# Patient Record
Sex: Female | Born: 1950 | Race: White | Hispanic: No | Smoking: Former smoker
Health system: Southern US, Community
[De-identification: ages and names within clinical notes are randomized; demographics above are authoritative.]

## PROBLEM LIST (undated history)

## (undated) DIAGNOSIS — F32A Depression, unspecified: Secondary | ICD-10-CM

## (undated) DIAGNOSIS — D649 Anemia, unspecified: Secondary | ICD-10-CM

## (undated) DIAGNOSIS — I471 Supraventricular tachycardia, unspecified: Secondary | ICD-10-CM

## (undated) DIAGNOSIS — Z8719 Personal history of other diseases of the digestive system: Secondary | ICD-10-CM

## (undated) DIAGNOSIS — M170 Bilateral primary osteoarthritis of knee: Secondary | ICD-10-CM

## (undated) DIAGNOSIS — F419 Anxiety disorder, unspecified: Secondary | ICD-10-CM

## (undated) DIAGNOSIS — I959 Hypotension, unspecified: Secondary | ICD-10-CM

## (undated) DIAGNOSIS — G8929 Other chronic pain: Secondary | ICD-10-CM

## (undated) DIAGNOSIS — R296 Repeated falls: Secondary | ICD-10-CM

## (undated) DIAGNOSIS — K219 Gastro-esophageal reflux disease without esophagitis: Secondary | ICD-10-CM

## (undated) DIAGNOSIS — F329 Major depressive disorder, single episode, unspecified: Secondary | ICD-10-CM

## (undated) DIAGNOSIS — G459 Transient cerebral ischemic attack, unspecified: Secondary | ICD-10-CM

## (undated) DIAGNOSIS — E7800 Pure hypercholesterolemia, unspecified: Secondary | ICD-10-CM

## (undated) DIAGNOSIS — H9192 Unspecified hearing loss, left ear: Secondary | ICD-10-CM

## (undated) DIAGNOSIS — M549 Dorsalgia, unspecified: Secondary | ICD-10-CM

## (undated) DIAGNOSIS — G629 Polyneuropathy, unspecified: Secondary | ICD-10-CM

## (undated) DIAGNOSIS — I499 Cardiac arrhythmia, unspecified: Secondary | ICD-10-CM

## (undated) DIAGNOSIS — IMO0001 Reserved for inherently not codable concepts without codable children: Secondary | ICD-10-CM

## (undated) DIAGNOSIS — Z8489 Family history of other specified conditions: Secondary | ICD-10-CM

## (undated) DIAGNOSIS — R112 Nausea with vomiting, unspecified: Secondary | ICD-10-CM

## (undated) DIAGNOSIS — M719 Bursopathy, unspecified: Secondary | ICD-10-CM

## (undated) DIAGNOSIS — M797 Fibromyalgia: Secondary | ICD-10-CM

## (undated) DIAGNOSIS — Z9889 Other specified postprocedural states: Secondary | ICD-10-CM

## (undated) HISTORY — PX: SHOULDER ARTHROSCOPY: SHX128

## (undated) HISTORY — PX: DILATION AND CURETTAGE OF UTERUS: SHX78

## (undated) HISTORY — PX: TUBAL LIGATION: SHX77

---

## 1998-10-23 ENCOUNTER — Other Ambulatory Visit: Admission: RE | Admit: 1998-10-23 | Discharge: 1998-10-23 | Payer: Self-pay | Admitting: Obstetrics and Gynecology

## 2003-05-04 ENCOUNTER — Encounter: Payer: Self-pay | Admitting: Emergency Medicine

## 2003-05-04 ENCOUNTER — Emergency Department (HOSPITAL_COMMUNITY): Admission: EM | Admit: 2003-05-04 | Discharge: 2003-05-04 | Payer: Self-pay | Admitting: Emergency Medicine

## 2003-05-16 ENCOUNTER — Encounter: Admission: RE | Admit: 2003-05-16 | Discharge: 2003-05-16 | Payer: Self-pay | Admitting: Internal Medicine

## 2003-05-16 ENCOUNTER — Ambulatory Visit (HOSPITAL_COMMUNITY): Admission: RE | Admit: 2003-05-16 | Discharge: 2003-05-16 | Payer: Self-pay | Admitting: Internal Medicine

## 2003-05-16 ENCOUNTER — Encounter: Payer: Self-pay | Admitting: Internal Medicine

## 2003-05-24 ENCOUNTER — Ambulatory Visit (HOSPITAL_COMMUNITY): Admission: RE | Admit: 2003-05-24 | Discharge: 2003-05-24 | Payer: Self-pay | Admitting: Internal Medicine

## 2003-09-20 ENCOUNTER — Encounter: Admission: RE | Admit: 2003-09-20 | Discharge: 2003-09-20 | Payer: Self-pay | Admitting: Internal Medicine

## 2005-07-13 ENCOUNTER — Ambulatory Visit: Payer: Self-pay | Admitting: Psychiatry

## 2005-07-13 ENCOUNTER — Other Ambulatory Visit (HOSPITAL_COMMUNITY): Admission: RE | Admit: 2005-07-13 | Discharge: 2005-07-16 | Payer: Self-pay | Admitting: Psychiatry

## 2005-08-19 ENCOUNTER — Encounter: Admission: RE | Admit: 2005-08-19 | Discharge: 2005-11-17 | Payer: Self-pay | Admitting: Psychology

## 2008-06-16 ENCOUNTER — Encounter: Admission: RE | Admit: 2008-06-16 | Discharge: 2008-06-16 | Payer: Self-pay | Admitting: Orthopedic Surgery

## 2008-09-14 ENCOUNTER — Encounter: Admission: RE | Admit: 2008-09-14 | Discharge: 2008-09-14 | Payer: Self-pay | Admitting: Orthopedic Surgery

## 2009-05-01 ENCOUNTER — Ambulatory Visit: Payer: Self-pay | Admitting: Infectious Diseases

## 2009-05-01 DIAGNOSIS — K219 Gastro-esophageal reflux disease without esophagitis: Secondary | ICD-10-CM | POA: Insufficient documentation

## 2009-05-01 DIAGNOSIS — J309 Allergic rhinitis, unspecified: Secondary | ICD-10-CM | POA: Insufficient documentation

## 2009-05-01 DIAGNOSIS — R000 Tachycardia, unspecified: Secondary | ICD-10-CM | POA: Insufficient documentation

## 2009-05-01 DIAGNOSIS — IMO0001 Reserved for inherently not codable concepts without codable children: Secondary | ICD-10-CM | POA: Insufficient documentation

## 2009-05-01 DIAGNOSIS — E7800 Pure hypercholesterolemia, unspecified: Secondary | ICD-10-CM | POA: Insufficient documentation

## 2011-08-03 DIAGNOSIS — IMO0001 Reserved for inherently not codable concepts without codable children: Secondary | ICD-10-CM

## 2011-08-03 HISTORY — DX: Reserved for inherently not codable concepts without codable children: IMO0001

## 2011-11-18 ENCOUNTER — Other Ambulatory Visit: Payer: Self-pay | Admitting: Gastroenterology

## 2011-11-19 ENCOUNTER — Ambulatory Visit
Admission: RE | Admit: 2011-11-19 | Discharge: 2011-11-19 | Disposition: A | Discharge status: Home / Self Care | Payer: Medicare Other | Source: Ambulatory Visit | Attending: Gastroenterology | Admitting: Gastroenterology

## 2012-03-16 ENCOUNTER — Emergency Department (HOSPITAL_BASED_OUTPATIENT_CLINIC_OR_DEPARTMENT_OTHER)
Admission: EM | Admit: 2012-03-16 | Discharge: 2012-03-16 | Disposition: A | Discharge status: Home / Self Care | Payer: Medicare Other | Attending: Emergency Medicine | Admitting: Emergency Medicine

## 2012-03-16 ENCOUNTER — Encounter (HOSPITAL_BASED_OUTPATIENT_CLINIC_OR_DEPARTMENT_OTHER): Payer: Self-pay | Admitting: Emergency Medicine

## 2012-03-16 ENCOUNTER — Emergency Department (HOSPITAL_BASED_OUTPATIENT_CLINIC_OR_DEPARTMENT_OTHER): Payer: Medicare Other

## 2012-03-16 DIAGNOSIS — J069 Acute upper respiratory infection, unspecified: Secondary | ICD-10-CM

## 2012-03-16 DIAGNOSIS — IMO0001 Reserved for inherently not codable concepts without codable children: Secondary | ICD-10-CM | POA: Insufficient documentation

## 2012-03-16 DIAGNOSIS — F17200 Nicotine dependence, unspecified, uncomplicated: Secondary | ICD-10-CM | POA: Insufficient documentation

## 2012-03-16 DIAGNOSIS — E7800 Pure hypercholesterolemia, unspecified: Secondary | ICD-10-CM | POA: Insufficient documentation

## 2012-03-16 HISTORY — DX: Fibromyalgia: M79.7

## 2012-03-16 HISTORY — DX: Pure hypercholesterolemia, unspecified: E78.00

## 2012-03-16 LAB — URINALYSIS, ROUTINE W REFLEX MICROSCOPIC
Ketones, ur: NEGATIVE mg/dL
Leukocytes, UA: NEGATIVE
Nitrite: NEGATIVE
Protein, ur: NEGATIVE mg/dL
Urobilinogen, UA: 0.2 mg/dL (ref 0.0–1.0)
pH: 6 (ref 5.0–8.0)

## 2012-03-16 MED ORDER — AZITHROMYCIN 250 MG PO TABS: 250.0000 mg | ORAL_TABLET | Freq: Every day | ORAL | Status: AC

## 2012-03-16 NOTE — ED Provider Notes (Signed)
History     CSN: [id number]  Arrival date & time 03/16/12  4098   First MD Initiated Contact with Patient 03/16/12 [phone]      Chief Complaint  Patient presents with  . Cough  . Headache  . Sore Throat    (Consider location/radiation/quality/duration/timing/severity/associated sxs/prior treatment) HPI Patient is a 61 year old female who presents today complaining of headache as well as sore throat over the past 4 days as well as development of a cough for the past 2 days. Patient has been around her mother who has had similar symptoms. The patient's mother was seen by her PCP and started on azithromycin for possible "viral infection versus allergies". Patient does note that her mother has COPD. Patient has no history of COPD or asthma but is a chronic smoker. She has not been treated with albuterol or prednisone for this in the past. Patient reports that her sore throat is actually better today. She was concerned as her mother got so sick with similar symptoms. Patient's PCP was unavailable today. She endorses some nausea as well some mild abdominal pain. She denies vomiting, constipation, diarrhea, chest pain, or shortness of breath. She's had no fevers. Patient has not taken anything for her symptoms. She has not tried over-the-counter medications or cough drops for her symptoms as well. Patient reports that this morning she felt significantly worse mainly based on increased fatigue and achiness. There are no other associated or modifying factors. Past Medical History  Diagnosis Date  . Fibromyalgia   . High cholesterol   . Tachycardia     History reviewed. No pertinent past surgical history.  History reviewed. No pertinent family history.  History  Substance Use Topics  . Smoking status: Current Everyday Smoker  . Smokeless tobacco: Not on file  . Alcohol Use: No    OB History    Grav Para Term Preterm Abortions TAB SAB Ect Mult Living                  Review of Systems    Constitutional: Positive for appetite change and fatigue.  HENT: Positive for congestion and sore throat.   Eyes: Negative.   Respiratory: Positive for cough.   Cardiovascular: Negative.   Gastrointestinal: Positive for nausea and abdominal pain.  Genitourinary: Negative.   Musculoskeletal: Positive for myalgias.  Skin: Negative.   Neurological: Positive for headaches.  Hematological: Negative.   Psychiatric/Behavioral: Negative.   All other systems reviewed and are negative.    Allergies  Codeine; Morphine and related; and Sulfa antibiotics  Home Medications   Current Outpatient Rx  Name Route Sig Dispense Refill  . AMITRIPTYLINE HCL 25 MG PO TABS Oral Take 25 mg by mouth 3 (three) times daily.    . ATENOLOL 50 MG PO TABS Oral Take 50 mg by mouth 2 (two) times daily.    Marland Kitchen PREMPRO PO Oral Take 1 tablet by mouth daily.    Marland Kitchen OMEPRAZOLE PO Oral Take by mouth 2 (two) times daily.    Marland Kitchen SIMVASTATIN PO Oral Take 1 tablet by mouth daily.      BP 107/55  Pulse 87  Temp 98.7 F (37.1 C) (Oral)  Resp 18  Ht 5\' 3"  (1.6 m)  Wt 150 lb (68.04 kg)  BMI 26.57 kg/m2  SpO2 100%  Physical Exam  Nursing note and vitals reviewed. GEN: Well-developed, well-nourished female in no distress HEENT: Atraumatic, normocephalic. Oropharynx with posterior erythema without exudates. EYES: PERRLA BL, no scleral icterus. NECK: Trachea midline, no  meningismus CV: regular rate and rhythm. No murmurs, rubs, or gallops PULM: No respiratory distress.  No crackles, wheezes, or rales. Diminished throughout. GI: soft, non-tender. No guarding, rebound, or tenderness. + bowel sounds  GU: deferred Neuro: cranial nerves grossly 2-12 intact, no abnormalities of strength or sensation, A and O x 3 MSK: Patient moves all 4 extremities symmetrically, no deformity, edema, or injury noted Skin: No rashes petechiae, purpura, or jaundice Psych: no abnormality of mood   ED Course  Procedures (including critical  care time)  Labs Reviewed  URINALYSIS, ROUTINE W REFLEX MICROSCOPIC - Abnormal; Notable for the following:    APPearance CLOUDY (*)     Hgb urine dipstick SMALL (*)     All other components within normal limits  URINE MICROSCOPIC-ADD ON - Abnormal; Notable for the following:    Squamous Epithelial / LPF FEW (*)     Bacteria, UA FEW (*)     All other components within normal limits   Dg Chest 2 View  03/16/2012  *RADIOLOGY REPORT*  Clinical Data: Cough, sore throat, smoking history  CHEST - 2 VIEW  Comparison: None.  Findings: No active infiltrate or effusion is seen.  The lungs are slightly hyperaerated consistent with emphysema.  There is a vague opacity in the right mid upper lung field which probably represents overlapping vascular and bony structures, but attention to this area on follow-up chest x-ray is recommended. The heart is within upper limits of normal in size.  No bony abnormality is seen.  IMPRESSION:  1.  Hyperaeration consistent with emphysema. 2.  Vague opacity in the right mid upper lung field probably overlapping bony and vascular structures.  Recommend follow-up chest x-ray. 3.  Borderline cardiomegaly  Original Report Authenticated By: Juline Patch, M.D.     1. URI (upper respiratory infection)       MDM  Patient was evaluated by myself. Based upon evaluation patient did have very diminished breath sounds. Chest x-ray was performed as she had complained of some slight abdominal pain and nausea beginning this morning. Patient had BP of 107 or 55 but was otherwise completely hemodynamically stable. Urinalysis was also sent. Patient had actually had improvement in her sore throat. No further testing was felt necessary for this complaint today. Chest x-ray was consistent with patient's chronic smoking history and COPD. There was an vague opacity noted in the right mid upper lung fields. This was thought to be artifact. Patient was notified of finding. She was given a copy of  this x-ray and told to follow this up with her primary care physician. Patient does have a PCP and is comfortable with plan. Given patient's recent exposure to her mother with similar symptoms patient was given azithromycin in the case of this being a possible early atypical infiltrate. I did not feel that further treatment was necessary today. Urine was negative. Patient most likely has a viral process. She has taken prednisone for back pain in the past and does not like the way this feels. I did not give this to her today. Additionally patient had diminished breath sounds but has never used albuterol before. She had no shortness of breath. Patient was discharged in good condition Z-Pak as described.        Cyndra Numbers, MD 03/16/12 1209

## 2012-03-16 NOTE — ED Notes (Signed)
Pt c/o "cold" sx - HA & sore throat since Mon, now has cough & feels "achy"

## 2013-05-22 ENCOUNTER — Other Ambulatory Visit: Payer: Self-pay | Admitting: Internal Medicine

## 2013-05-22 DIAGNOSIS — I10 Essential (primary) hypertension: Secondary | ICD-10-CM

## 2014-03-14 ENCOUNTER — Inpatient Hospital Stay (HOSPITAL_COMMUNITY): Payer: Medicare Other

## 2014-03-14 ENCOUNTER — Encounter (HOSPITAL_COMMUNITY): Payer: Self-pay | Admitting: Emergency Medicine

## 2014-03-14 ENCOUNTER — Observation Stay (HOSPITAL_COMMUNITY)
Admission: EM | Admit: 2014-03-14 | Discharge: 2014-03-16 | Disposition: A | Discharge status: Home / Self Care | Payer: Medicare Other | Attending: Family Medicine | Admitting: Family Medicine

## 2014-03-14 ENCOUNTER — Emergency Department (HOSPITAL_COMMUNITY): Payer: Medicare Other

## 2014-03-14 DIAGNOSIS — R55 Syncope and collapse: Secondary | ICD-10-CM

## 2014-03-14 DIAGNOSIS — R5381 Other malaise: Secondary | ICD-10-CM | POA: Insufficient documentation

## 2014-03-14 DIAGNOSIS — K219 Gastro-esophageal reflux disease without esophagitis: Secondary | ICD-10-CM | POA: Insufficient documentation

## 2014-03-14 DIAGNOSIS — R000 Tachycardia, unspecified: Secondary | ICD-10-CM | POA: Diagnosis not present

## 2014-03-14 DIAGNOSIS — M6281 Muscle weakness (generalized): Secondary | ICD-10-CM

## 2014-03-14 DIAGNOSIS — IMO0001 Reserved for inherently not codable concepts without codable children: Secondary | ICD-10-CM

## 2014-03-14 DIAGNOSIS — R0789 Other chest pain: Secondary | ICD-10-CM | POA: Diagnosis not present

## 2014-03-14 DIAGNOSIS — R42 Dizziness and giddiness: Secondary | ICD-10-CM | POA: Diagnosis not present

## 2014-03-14 DIAGNOSIS — Z87891 Personal history of nicotine dependence: Secondary | ICD-10-CM | POA: Insufficient documentation

## 2014-03-14 DIAGNOSIS — Z79899 Other long term (current) drug therapy: Secondary | ICD-10-CM | POA: Insufficient documentation

## 2014-03-14 DIAGNOSIS — R0602 Shortness of breath: Secondary | ICD-10-CM | POA: Insufficient documentation

## 2014-03-14 DIAGNOSIS — E7800 Pure hypercholesterolemia, unspecified: Secondary | ICD-10-CM | POA: Diagnosis not present

## 2014-03-14 DIAGNOSIS — R531 Weakness: Secondary | ICD-10-CM

## 2014-03-14 DIAGNOSIS — R079 Chest pain, unspecified: Secondary | ICD-10-CM

## 2014-03-14 DIAGNOSIS — R002 Palpitations: Secondary | ICD-10-CM | POA: Diagnosis present

## 2014-03-14 DIAGNOSIS — R5383 Other fatigue: Secondary | ICD-10-CM

## 2014-03-14 HISTORY — DX: Other specified postprocedural states: Z98.890

## 2014-03-14 HISTORY — DX: Personal history of other diseases of the digestive system: Z87.19

## 2014-03-14 HISTORY — DX: Nausea with vomiting, unspecified: R11.2

## 2014-03-14 HISTORY — DX: Dorsalgia, unspecified: M54.9

## 2014-03-14 HISTORY — DX: Transient cerebral ischemic attack, unspecified: G45.9

## 2014-03-14 HISTORY — DX: Supraventricular tachycardia: I47.1

## 2014-03-14 HISTORY — DX: Reserved for inherently not codable concepts without codable children: IMO0001

## 2014-03-14 HISTORY — DX: Supraventricular tachycardia, unspecified: I47.10

## 2014-03-14 HISTORY — DX: Depression, unspecified: F32.A

## 2014-03-14 HISTORY — DX: Hypotension, unspecified: I95.9

## 2014-03-14 HISTORY — DX: Major depressive disorder, single episode, unspecified: F32.9

## 2014-03-14 HISTORY — DX: Repeated falls: R29.6

## 2014-03-14 HISTORY — DX: Bilateral primary osteoarthritis of knee: M17.0

## 2014-03-14 HISTORY — DX: Unspecified hearing loss, left ear: H91.92

## 2014-03-14 HISTORY — DX: Family history of other specified conditions: Z84.89

## 2014-03-14 HISTORY — DX: Other chronic pain: G89.29

## 2014-03-14 HISTORY — DX: Anxiety disorder, unspecified: F41.9

## 2014-03-14 HISTORY — DX: Bursopathy, unspecified: M71.9

## 2014-03-14 HISTORY — DX: Anemia, unspecified: D64.9

## 2014-03-14 HISTORY — DX: Gastro-esophageal reflux disease without esophagitis: K21.9

## 2014-03-14 LAB — CBC WITH DIFFERENTIAL/PLATELET
BASOS ABS: 0 10*3/uL (ref 0.0–0.1)
Basophils Relative: 0 % (ref 0–1)
EOS PCT: 1 % (ref 0–5)
Eosinophils Absolute: 0.1 10*3/uL (ref 0.0–0.7)
HCT: 35.4 % — ABNORMAL LOW (ref 36.0–46.0)
Hemoglobin: 11.9 g/dL — ABNORMAL LOW (ref 12.0–15.0)
LYMPHS ABS: 2.6 10*3/uL (ref 0.7–4.0)
LYMPHS PCT: 34 % (ref 12–46)
MCH: 30.6 pg (ref 26.0–34.0)
MCHC: 33.6 g/dL (ref 30.0–36.0)
MCV: 91 fL (ref 78.0–100.0)
Monocytes Absolute: 0.5 10*3/uL (ref 0.1–1.0)
Monocytes Relative: 7 % (ref 3–12)
NEUTROS ABS: 4.4 10*3/uL (ref 1.7–7.7)
NEUTROS PCT: 58 % (ref 43–77)
PLATELETS: 259 10*3/uL (ref 150–400)
RBC: 3.89 MIL/uL (ref 3.87–5.11)
RDW: 14.6 % (ref 11.5–15.5)
WBC: 7.6 10*3/uL (ref 4.0–10.5)

## 2014-03-14 LAB — ETHANOL: Alcohol, Ethyl (B): <11 mg/dL (ref 0–11)

## 2014-03-14 LAB — COMPREHENSIVE METABOLIC PANEL
ALK PHOS: 82 U/L (ref 39–117)
ALT: 15 U/L (ref 0–35)
AST: 16 U/L (ref 0–37)
Albumin: 3.3 g/dL — ABNORMAL LOW (ref 3.5–5.2)
Anion gap: 12 (ref 5–15)
BUN: 8 mg/dL (ref 6–23)
CALCIUM: 8.5 mg/dL (ref 8.4–10.5)
CO2: 24 meq/L (ref 19–32)
Chloride: 103 mEq/L (ref 96–112)
Creatinine, Ser: 0.61 mg/dL (ref 0.50–1.10)
GFR calc Af Amer: >90 mL/min (ref >90)
Glucose, Bld: 94 mg/dL (ref 70–99)
POTASSIUM: 4 meq/L (ref 3.7–5.3)
SODIUM: 139 meq/L (ref 137–147)
Total Bilirubin: 0.2 mg/dL — ABNORMAL LOW (ref 0.3–1.2)
Total Protein: 6.3 g/dL (ref 6.0–8.3)

## 2014-03-14 LAB — LIPID PANEL
CHOL/HDL RATIO: 2.7 ratio
CHOLESTEROL: 139 mg/dL (ref 0–200)
HDL: 52 mg/dL (ref >39)
LDL Cholesterol: 61 mg/dL (ref 0–99)
Triglycerides: 128 mg/dL (ref <150)
VLDL: 26 mg/dL (ref 0–40)

## 2014-03-14 LAB — RAPID URINE DRUG SCREEN, HOSP PERFORMED
AMPHETAMINES: NOT DETECTED
Barbiturates: NOT DETECTED
Benzodiazepines: NOT DETECTED
COCAINE: NOT DETECTED
Opiates: NOT DETECTED
TETRAHYDROCANNABINOL: NOT DETECTED

## 2014-03-14 LAB — TROPONIN I: Troponin I: <0.3 ng/mL (ref <0.30)

## 2014-03-14 LAB — I-STAT TROPONIN, ED: Troponin i, poc: 0 ng/mL (ref 0.00–0.08)

## 2014-03-14 MED ORDER — SIMVASTATIN 40 MG PO TABS
40.0000 mg | ORAL_TABLET | Freq: Every day | ORAL | Status: DC
  Administered 2014-03-15: 40 mg via ORAL
  Filled 2014-03-14 (×2): qty 1

## 2014-03-14 MED ORDER — LAMOTRIGINE 200 MG PO TABS
200.0000 mg | ORAL_TABLET | Freq: Every day | ORAL | Status: DC
  Administered 2014-03-14 – 2014-03-15 (×2): 200 mg via ORAL
  Filled 2014-03-14 (×3): qty 1

## 2014-03-14 MED ORDER — LORATADINE 10 MG PO CAPS: 10.0000 mg | ORAL_CAPSULE | Freq: Every day | ORAL | Status: DC | PRN

## 2014-03-14 MED ORDER — PANTOPRAZOLE SODIUM 40 MG PO TBEC
80.0000 mg | DELAYED_RELEASE_TABLET | Freq: Every day | ORAL | Status: DC
Start: 2014-03-15 — End: 2014-03-15
  Administered 2014-03-14 – 2014-03-15 (×2): 80 mg via ORAL
  Filled 2014-03-14: qty 2

## 2014-03-14 MED ORDER — HEPARIN SODIUM (PORCINE) 5000 UNIT/ML IJ SOLN
5000.0000 [IU] | Freq: Three times a day (TID) | INTRAMUSCULAR | Status: DC
  Administered 2014-03-14 – 2014-03-16 (×5): 5000 [IU] via SUBCUTANEOUS
  Filled 2014-03-14 (×9): qty 1

## 2014-03-14 MED ORDER — IPRATROPIUM BROMIDE 0.06 % NA SOLN
2.0000 | Freq: Four times a day (QID) | NASAL | Status: DC
  Administered 2014-03-16: 2 via NASAL
  Filled 2014-03-14 (×2): qty 15

## 2014-03-14 MED ORDER — ASPIRIN 81 MG PO CHEW
324.0000 mg | CHEWABLE_TABLET | Freq: Once | ORAL | Status: AC
  Administered 2014-03-14: 324 mg via ORAL
  Filled 2014-03-14: qty 4

## 2014-03-14 MED ORDER — ATENOLOL 50 MG PO TABS
50.0000 mg | ORAL_TABLET | Freq: Two times a day (BID) | ORAL | Status: DC
  Administered 2014-03-15 – 2014-03-16 (×3): 50 mg via ORAL
  Filled 2014-03-14 (×5): qty 1

## 2014-03-14 MED ORDER — SODIUM CHLORIDE 0.9 % IJ SOLN
3.0000 mL | Freq: Two times a day (BID) | INTRAMUSCULAR | Status: DC
  Administered 2014-03-14 – 2014-03-16 (×3): 3 mL via INTRAVENOUS

## 2014-03-14 MED ORDER — LORATADINE 10 MG PO TABS
10.0000 mg | ORAL_TABLET | Freq: Every day | ORAL | Status: DC
  Administered 2014-03-15 – 2014-03-16 (×2): 10 mg via ORAL
  Filled 2014-03-14 (×2): qty 1

## 2014-03-14 MED ORDER — RISPERIDONE 0.5 MG PO TABS
0.5000 mg | ORAL_TABLET | Freq: Two times a day (BID) | ORAL | Status: DC
  Administered 2014-03-14 – 2014-03-16 (×4): 0.5 mg via ORAL
  Filled 2014-03-14 (×5): qty 1

## 2014-03-14 MED ORDER — ADULT MULTIVITAMIN W/MINERALS CH
1.0000 | ORAL_TABLET | Freq: Every day | ORAL | Status: DC
  Administered 2014-03-15 – 2014-03-16 (×2): 1 via ORAL
  Filled 2014-03-14 (×2): qty 1

## 2014-03-14 MED ORDER — AMITRIPTYLINE HCL 25 MG PO TABS
25.0000 mg | ORAL_TABLET | Freq: Three times a day (TID) | ORAL | Status: DC
  Administered 2014-03-14: 25 mg via ORAL
  Filled 2014-03-14 (×4): qty 1

## 2014-03-14 MED ORDER — SODIUM CHLORIDE 0.9 % IV BOLUS (SEPSIS)
1000.0000 mL | Freq: Once | INTRAVENOUS | Status: AC
  Administered 2014-03-14: 1000 mL via INTRAVENOUS

## 2014-03-14 NOTE — ED Notes (Signed)
Pt arrives via EMS from home with palpitation that began approx 730 this AM while walking around the kitchen this AM. Pt states again had palpitations at 930 and SOB. EMS 12 lead unremarkable. Given 02 for supportive measures which helped SOB. Pt awake, alert, oriented x4, NAD. Pts family members state patient has fallen twice in the last 3 weeks ago due to dizziness.

## 2014-03-14 NOTE — ED Provider Notes (Signed)
CSN: [id number]     Arrival date & time 03/14/14  1012 History   First MD Initiated Contact with Patient 03/14/14 1035     Chief Complaint  Patient presents with  . Palpitations  . Shortness of Breath     (Consider location/radiation/quality/duration/timing/severity/associated sxs/prior Treatment) HPI Comments: Patient presents emergency department with chief complaint of heart palpitations that began this morning at 7:30 while she was walking in her kitchen. She states that in the palpitations then reoccurred at 9:30. She reports some associated shortness of breath and chest pressure. She states that she has a history of the same remotely, but has not been seen by cardiology in over 15 years. Additionally, she states that she gets very dizzy easily, and falls frequently. She is currently taking atenolol 50 mg twice a day, but states that she increases this to 100 mg twice a day when she has palpitations. The symptoms are worse when she stands. She denies any other problems at this time.  The history is provided by the patient. No language interpreter was used.    Past Medical History  Diagnosis Date  . Fibromyalgia   . High cholesterol   . Tachycardia   . GERD (gastroesophageal reflux disease)    Past Surgical History  Procedure Laterality Date  . Shoulder surgery     No family history on file. History  Substance Use Topics  . Smoking status: Former Smoker    Quit date: 03/14/2012  . Smokeless tobacco: Not on file  . Alcohol Use: Yes     Comment: occasional   OB History   Grav Para Term Preterm Abortions TAB SAB Ect Mult Living                 Review of Systems  All other systems reviewed and are negative.     Allergies  Codeine; Morphine and related; and Sulfa antibiotics  Home Medications   Prior to Admission medications   Medication Sig Start Date End Date Taking? Authorizing Provider  amitriptyline (ELAVIL) 25 MG tablet Take 25 mg by mouth 3 (three) times  daily.    Historical Provider, MD  atenolol (TENORMIN) 50 MG tablet Take 50 mg by mouth 2 (two) times daily.    Historical Provider, MD  Conj Estrog-Medroxyprogest Ace (PREMPRO PO) Take 1 tablet by mouth daily.    Historical Provider, MD  OMEPRAZOLE PO Take by mouth 2 (two) times daily.    Historical Provider, MD  SIMVASTATIN PO Take 1 tablet by mouth daily.    Historical Provider, MD   Pulse 69  Temp(Src) 98.1 F (36.7 C) (Oral)  Resp 18  SpO2 96% Physical Exam  Nursing note and vitals reviewed. Constitutional: She is oriented to person, place, and time. She appears well-developed and well-nourished.  HENT:  Head: Normocephalic and atraumatic.  Eyes: Conjunctivae and EOM are normal. Pupils are equal, round, and reactive to light.  Neck: Normal range of motion. Neck supple.  Cardiovascular: Normal rate and regular rhythm.  Exam reveals no gallop and no friction rub.   No murmur heard. Pulmonary/Chest: Effort normal and breath sounds normal. No respiratory distress. She has no wheezes. She has no rales. She exhibits no tenderness.  Abdominal: Soft. Bowel sounds are normal. She exhibits no distension and no mass. There is no tenderness. There is no rebound and no guarding.  Musculoskeletal: Normal range of motion. She exhibits no edema and no tenderness.  Neurological: She is alert and oriented to person, place, and time.  Skin: Skin is warm and dry.  Psychiatric: She has a normal mood and affect. Her behavior is normal. Judgment and thought content normal.    ED Course  Procedures (including critical care time) Results for orders placed during the hospital encounter of 03/14/14  CBC WITH DIFFERENTIAL      Result Value Ref Range   WBC 7.6  4.0 - 10.5 K/uL   RBC 3.89  3.87 - 5.11 MIL/uL   Hemoglobin 11.9 (*) 12.0 - 15.0 g/dL   HCT 29.5 (*) 62.1 - 30.8 %   MCV 91.0  78.0 - 100.0 fL   MCH 30.6  26.0 - 34.0 pg   MCHC 33.6  30.0 - 36.0 g/dL   RDW 65.7  84.6 - 96.2 %   Platelets 259   150 - 400 K/uL   Neutrophils Relative % 58  43 - 77 %   Neutro Abs 4.4  1.7 - 7.7 K/uL   Lymphocytes Relative 34  12 - 46 %   Lymphs Abs 2.6  0.7 - 4.0 K/uL   Monocytes Relative 7  3 - 12 %   Monocytes Absolute 0.5  0.1 - 1.0 K/uL   Eosinophils Relative 1  0 - 5 %   Eosinophils Absolute 0.1  0.0 - 0.7 K/uL   Basophils Relative 0  0 - 1 %   Basophils Absolute 0.0  0.0 - 0.1 K/uL  COMPREHENSIVE METABOLIC PANEL      Result Value Ref Range   Sodium 139  137 - 147 mEq/L   Potassium 4.0  3.7 - 5.3 mEq/L   Chloride 103  96 - 112 mEq/L   CO2 24  19 - 32 mEq/L   Glucose, Bld 94  70 - 99 mg/dL   BUN 8  6 - 23 mg/dL   Creatinine, Ser 9.52  0.50 - 1.10 mg/dL   Calcium 8.5  8.4 - 84.1 mg/dL   Total Protein 6.3  6.0 - 8.3 g/dL   Albumin 3.3 (*) 3.5 - 5.2 g/dL   AST 16  0 - 37 U/L   ALT 15  0 - 35 U/L   Alkaline Phosphatase 82  39 - 117 U/L   Total Bilirubin 0.2 (*) 0.3 - 1.2 mg/dL   GFR calc non Af Amer >90  >90 mL/min   GFR calc Af Amer >90  >90 mL/min   Anion gap 12  5 - 15  I-STAT TROPOININ, ED      Result Value Ref Range   Troponin i, poc 0.00  0.00 - 0.08 ng/mL   Comment 3            Dg Chest 2 View  03/14/2014   CLINICAL DATA:  Shortness of breath and cardiac palpitations  EXAM: CHEST  2 VIEW  COMPARISON:  March 16, 2012  FINDINGS: There is no edema or consolidation. Heart size and pulmonary vascularity are normal. No adenopathy. No bone lesions. A previously noted somewhat vague nodular opacity in the right upper lobe is no longer appreciable.  IMPRESSION: No edema or consolidation.   Electronically Signed   By: Bretta Bang M.D.   On: 03/14/2014 11:05     Imaging Review No results found.   EKG Interpretation   Date/Time:  Thursday March 14 2014 10:23:46 EDT Ventricular Rate:  70 PR Interval:  171 QRS Duration: 86 QT Interval:  390 QTC Calculation: 421 R Axis:   24 Text Interpretation:  Sinus rhythm Low voltage, precordial leads No  previous ECGs  available  Confirmed by YAO  MD, DAVID ([id number]) on 03/14/2014  10:27:58 AM      MDM   Final diagnoses:  Palpitations  Syncope and collapse    Patient with dizziness and SOB with heart palpitations.  History of SVT, has been taking atenolol for the past 15 years.  Not followed by cardiology now.  Will check labs, EKG, and CXR.  Discussed patient with Dr. Silverio Lay, who will see the patient.  Will order delta troponin.  Consider cardiology consult.  Patient seen by and discussed with Dr. Silverio Lay.  Cardiology was consulted.  Dr. Elease Hashimoto recommends admission to medicine.  Discussed the patient with family practice, who will admit.   Roxy Horseman, PA-C 03/14/14 1536

## 2014-03-14 NOTE — Consult Note (Signed)
Cardiology Consultation Note  Patient ID: Terry Ball, MRN: [id number], DOB/AGE: 1950-11-26 63 y.o. Admit date: 03/14/2014   Date of Consult: 03/14/2014 Primary Physician: Mia Creek, MD Primary Cardiologist: New to [facility]  Chief Complaint: Palpitations and SOB Reason for Consult: Palpitations and SOB  HPI: 63 year old female with history of reported SVT 15 years (diagnosed at [facility]), TIAs, and hyperlipidemia who presents to [facility] ED today after developing palpitations with associated chest tightness around 7:30 AM lasting approximately 15-20 minutes. She was checking her pulse feeling 2-4 beats at a time, then a skipped beat. She takes Atenolol 50 mg bid regularly for her previously diagnosed SVT so she went ahead and took an extra 50 mg tab this AM for a total of 100 mg. She dialed 911, but set them home upon arrival 2/2 her symptoms had resolved. She reports approximately 30 minutes later her palpitations returned prompting her to again dial 911, this time coming to the hospital. Her chest tightness is characterized as substernal and does not radiate. No associated nausea or vomiting. No diaphoresis.  Of note, she has suffered quite a lot of falls lately dating back to the beginning of March. Some of these are falls in which she has fallen off to the side and others are her falling backwards. She also had a fall back in March where she hit her head pretty good. No medical care was sought. She is unaware of any palpitations during those events. She also denies any seizure-like activity. No loss of bowel or bladder function. Denies syncope. She is a fairly active person at home, even mows the lawn. Some increase in SOB lately. Sleeps with one pillow. Some early satiety. No LEE.  Her daughter, who is a critical care nurse here at [facility], reports the patient had a negative nuclear treadmill 1.5 years ago at [facility].   In the ED she was found to have a negative POCT  troponin, K+ 4.0, hgb 11.9, CXR with no edema or consolidation, BP running between 106/51-117/54, pulse 84.  Past Medical History  Diagnosis Date  . Fibromyalgia   . High cholesterol   . Tachycardia     SVT-diagnosed by [facility] 2000  . GERD (gastroesophageal reflux disease)   . Normal cardiac stress test 2013      Most Recent Cardiac Studies: See above for reported studies from [facility].    Surgical History:  Past Surgical History  Procedure Laterality Date  . Shoulder surgery       Home Meds: Prior to Admission medications   Medication Sig Start Date End Date Taking? Authorizing Provider  amitriptyline (ELAVIL) 25 MG tablet Take 25 mg by mouth 3 (three) times daily.    Historical Provider, MD  atenolol (TENORMIN) 50 MG tablet Take 50 mg by mouth 2 (two) times daily.    Historical Provider, MD  Conj Estrog-Medroxyprogest Ace (PREMPRO PO) Take 1 tablet by mouth daily.    Historical Provider, MD  OMEPRAZOLE PO Take by mouth 2 (two) times daily.    Historical Provider, MD  SIMVASTATIN PO Take 1 tablet by mouth daily.    Historical Provider, MD    Inpatient Medications:     Allergies:  Allergies  Allergen Reactions  . Codeine     REACTION: rash, itching  . Morphine And Related Itching    + nausea  . Sulfa Antibiotics Nausea Only    History   Social History  . Marital Status:  Widowed    Spouse Name: [name]    Number of Children: [name]  . Years of Education: [name]   Occupational History  . Not on file.   Social History Main Topics  . Smoking status: Former Smoker -- 1.00 packs/day for 30 years    Types: Cigarettes    Quit date: 03/14/2012  . Smokeless tobacco: Never Used  . Alcohol Use: Yes     Comment: occasional  . Drug Use: No  . Sexual Activity: Not on file   Other Topics Concern  . Not on file   Social History Narrative  . No narrative on file     Family History  Problem Relation Age of Onset  . CAD Brother   . CAD Paternal Grandfather       Review of Systems: General: negative for chills, fever, night sweats or weight changes.  Cardiovascular: negative for edema, orthopnea, paroxysmal nocturnal dyspnea, shortness of breath or dyspnea on exertion Dermatological: negative for rash Respiratory: negative for cough or wheezing Urologic: negative for hematuria Abdominal: negative for nausea, vomiting, diarrhea, bright red blood per rectum, melena, or hematemesis Neurologic: negative for visual changes, syncope, or dizziness All other systems reviewed and are otherwise negative except as noted above.  Labs: No results found for this basename: CKTOTAL, CKMB, TROPONINI,  in the last 72 hours Lab Results  Component Value Date   WBC 7.6 03/14/2014   HGB 11.9* 03/14/2014   HCT 35.4* 03/14/2014   MCV 91.0 03/14/2014   PLT 259 03/14/2014     Recent Labs Lab 03/14/14 1035  NA 139  K 4.0  CL 103  CO2 24  BUN 8  CREATININE 0.61  CALCIUM 8.5  PROT 6.3  BILITOT 0.2*  ALKPHOS 82  ALT 15  AST 16  GLUCOSE 94   No results found for this basename: CHOL,  HDL,  LDLCALC,  TRIG   No results found for this basename: DDIMER    Radiology/Studies:  Dg Chest 2 View  03/14/2014   CLINICAL DATA:  Shortness of breath and cardiac palpitations  EXAM: CHEST  2 VIEW  COMPARISON:  March 16, 2012  FINDINGS: There is no edema or consolidation. Heart size and pulmonary vascularity are normal. No adenopathy. No bone lesions. A previously noted somewhat vague nodular opacity in the right upper lobe is no longer appreciable.  IMPRESSION: No edema or consolidation.   Electronically Signed   By: Bretta Bang M.D.   On: 03/14/2014 11:05    EKG: NSR, 70, low voltage precordial leads, no st/t changes  Physical Exam: Blood pressure 106/61, pulse 71, temperature 98.2 F (36.8 C), temperature source Oral, resp. rate 12, SpO2 100.00%. General: Well developed, well nourished, in no acute distress. Head: Normocephalic, atraumatic, sclera non-icteric,  no xanthomas, nares are without discharge.  Neck: Negative for carotid bruits. JVD not elevated. Lungs: Clear bilaterally to auscultation without wheezes, rales, or rhonchi. Breathing is unlabored. Heart: RRR with S1 S2. No murmurs, rubs, or gallops appreciated. Abdomen: Soft, non-tender, non-distended with normoactive bowel sounds. No hepatomegaly. No rebound/guarding. No obvious abdominal masses. Msk:  Strength and tone appear normal for age. Extremities: No clubbing or cyanosis. No edema.  Distal pedal pulses are 2+ and equal bilaterally. Multiple late stage bruising of the bilateral legs.  Neuro: Alert and oriented X 3. Very slight right sided lip droop. No focal deficit. Moves all extremities spontaneously. 5/5 strength. Slight increase sensation of the right-side face.  Psych:  Responds to questions appropriately with a  normal affect.     Assessment and Plan: 63 year old female with history of reported SVT 15 years ago, TIAs, and hyperlipidemia who presented to [facility] today with palpitations, SOB, and multiple falls.  1) Palpitations: She reports a history of SVT/palpitations approximately 15 years ago diagnosed by [facility]. She was placed on Atenolol 50 mg bid for this at that time and his been on this ever since. Today was the first time she has ever taken an extra dose of her Atenolol 2/2 palpitations. In the ER she is currently in NSR with a HR in the 70s and asymptomatic. Given her history of multiple falls she needs a workup for further arrhythmia. While an inpatient telemetry will help us look at her rhythm. If we do not see any specific arrhythmia on telemetry would need outpatient monitoring.   2) Chest tightness: She has a family history significant of CAD. Brother and grandfather both with MI. Her only nuclear study was 1.5 years ago and per patient's family normal, although I do not have this report. She has never had a cardiac cath. First troponin is negative.  Will cycle troponin and obtain echo. If studies dictate consider further ischemic workup and possible cardiac cath, although with normal nuclear recently may be low probability for ischemia.   3) Multiple falls: There is question if her falls are 2/2 her palpitations, possible ischemia, vs vascular disease. Will check head CT, carotid dopplers, and cardiac evaluation per above. Add on TSH. If no obvious long pauses on telemetry overnight will have neuro see her. Blood glucose 94. Review of telemetry indicates NSR with rates in the 70s. Without long pauses seen on telemetry or syncope associated with these falls it may make them less likely to be 2/2 cardiac etiology. Would pursue neuro work up. We will consult on her.   4) Hyperlipidemia: On statin.          Elinor DodgeSigned, DUNN,RYAN PA-C 03/14/2014, 2:43 PM  Attending Note:   The patient was seen and examined.  Agree with assessment and plan as noted above.  Changes made to the above note as needed.  Very unusual presentation -  Hx of SVT in the past Hx of TIAs Anxiety Loss of balance - falls to the right and to the left.  1. Palpitations:  Patient had palpitations this am - lasted for 20 minutes.  Called EMS but they were not able to find any heart rate irregularities.  Called EMS when the palpitations returned and was brought to the ED.  No syncope today.  Here in the ED, her rhythm is perfectly regular. No pauses, no tachycardia.  She needs to be monitored for several days to make sure she is not having significant pauses.  She can go home with an event monitor.  Can even get a Loop recorder .  Echo  Troponin levels.   2. Frequent falls:  These episodes sound more like a vestibular issue.  I cannot rule out sinus pauses and we need to watch her for this but her rhythm has been very stable today ( even after taking the extra Atenolol, she has not had any pauses)   3. Hx of TIA:  She needs a neuro evaluation.  She may be having more TIAs.   Needs carotid duplex, may need TEE and or Implantable Loop recorder.   4. Hx of SVT:  We do not have any records of this.  This may in fact be a-fib given  her hx of TIAs also.  Will need to watch for this .    Following .    Vesta Mixer, Montez Hageman., MD, [facility] 03/14/2014, 3:03 PM [address]  [address] Office [phone] Pager [phone]

## 2014-03-14 NOTE — H&P (Signed)
Family Medicine Teaching [facility] Admission History and Physical Service Pager: [phone]  Patient name: Terry Ball Medical record number: [id number] Date of birth: 12/16/1950 Age: 63 y.o. Gender: female  Primary Care Provider: Mia Creek, MD Consultants: cards Code Status:   Chief Complaint: chest palpitations; increased falls   Assessment and Plan: Terry Ball is a 63 y.o. female presenting with  . PMH is significant for SVT, TIA, hyperlipidemia   Chest palps- Currently in sinus rhythm with nml EKG and neg itrop, no assc syncopal episodes. Monitored on tele in the ED without arrythmias. No signs of electrolyte abnormalities; Questionable paroxysmal arrythmia? Vs sinus pause vs valvular dysfunction vs angina (Did have stress test at [facility] several months ago that was apparently normal but has fmhx signif for CAD). S/p cards eval in the ED -admit to tele under Dr.Hensel -cycle trops -2D echo -vitals per floor protocol -potentially will need Loop -cards following, appreciate recs  Falls- in setting of prior TIA; questionably related to the above, ddx includes TIA (esp with hx will look at carotids and risk stratify, unable to calculate ASCVD without further information) vs cerebellar pathology (given visual issues and imbalance) vs arrythmia (in light of prior SVT?) vs vertigo vs vestibular imbalance (does have a hx of vertigo, but having falls forward as well as backward) vs subclinical seizures(no assc seizure like activity with episodes? but appears that these are largely unwitnessed); -CT head with/without -b12/folate -hiv/rpr  -carotid doppler -neuro consult, appreciate recs -A1C, TSH -UDS -fall precautions, up with assistance  Fibromyalgia- chronic; on elavil -will cont home medication  Bipolar?- on lamictal and risperidal? Has anxious/unusual affect, although very pleasant -will attempt to clarify hx further with patient -may benefit from  MSE  GERD- taking NSAIDs in addition to twice daily PPIs -hosp formulary protonix here  Hyperlipidemia- on statin -continue   FEN/GI: KVO; gen diet Prophylaxis: HSQ  Disposition: admit to tele under Dr. Leveda Anna   History of Present Illness: Terry Ball is a 63 y.o. female presenting with palpitations and assc chest tightness starting around 7:30 this morning that lasted approx 15 minutes and again at 830. Did have a similar episode July 30, august 1st. Has assc SOB with sx. Took an additional atenolol and called 911. Does have a hx of SVT (dx at [facility])  Of note she has had increased falls in the last 1 year. Recently fell off mower. Can happen even while standing. Did have one episode of head trauma assc, falling both backwards and forwards. Does note that she is unstable prior to episode and feels an assc flutter in her chest but denies CP, SOB prior.  In the ED she was found to have a negative POCT troponin, K+ 4.0, hgb 11.9, CXR with no edema or consolidation, BP running between 106/51-117/54, pulse 84  Review Of Systems: Per HPI with the following additions: none Otherwise 12 point review of systems was performed and was unremarkable.  Patient Active Problem List   Diagnosis Date Noted  . Palpitations 03/14/2014  . HYPERCHOLESTEROLEMIA 05/01/2009  . ALLERGIC RHINITIS CAUSE UNSPECIFIED 05/01/2009  . GERD 05/01/2009  . FIBROMYALGIA 05/01/2009  . UNSPECIFIED TACHYCARDIA 05/01/2009   Past Medical History: Past Medical History  Diagnosis Date  . Fibromyalgia   . High cholesterol   . Tachycardia     SVT-diagnosed by [facility] 2000  . GERD (gastroesophageal reflux disease)   . Normal cardiac stress test 2013   Past Surgical History: Past Surgical  History  Procedure Laterality Date  . Shoulder surgery     Social History: History  Substance Use Topics  . Smoking status: Former Smoker -- 1.00 packs/day for 30 years    Types: Cigarettes    Quit  date: 03/14/2012  . Smokeless tobacco: Never Used  . Alcohol Use: Yes     Comment: occasional   Additional social history: former user of cigs; lives alone  Please also refer to relevant sections of EMR.  Family History: Family History  Problem Relation Age of Onset  . CAD Brother   . CAD Paternal Grandfather    Allergies and Medications: Allergies  Allergen Reactions  . Codeine     REACTION: rash, itching  . Morphine And Related Itching    + nausea  . Sulfa Antibiotics Nausea Only   No current facility-administered medications on file prior to encounter.   Current Outpatient Prescriptions on File Prior to Encounter  Medication Sig Dispense Refill  . amitriptyline (ELAVIL) 25 MG tablet Take 25 mg by mouth 3 (three) times daily.      Marland Kitchen atenolol (TENORMIN) 50 MG tablet Take 50 mg by mouth 2 (two) times daily.      Marland Kitchen Conj Estrog-Medroxyprogest Ace (PREMPRO PO) Take 1 tablet by mouth daily.        Objective: BP 109/52  Pulse 74  Temp(Src) 98.2 F (36.8 C) (Oral)  Resp 16  SpO2 98% Exam: General: anxious affect; awake, conversant, pleasant HEENT: NCAT, pinpoint pupils, no lymphadenopathy appreciated Cardiovascular: RRR, no m/r/g Respiratory: CTAB, normal WOB Abdomen: S, NTND, normoactive Extremities: Varices present; bilat feet cold; DPs 1+ Skin: No signs of skin break down;   Neuro: A&O X3; CN ii-xii tested (some decrease sensation on left side of face); no clonus present; neg rhomberg; FTN/HTS/RAM without deficit; strength equal and symmetric on upper and lower extremities; gait not formally tested  Labs and Imaging: CBC BMET   Recent Labs Lab 03/14/14 1035  WBC 7.6  HGB 11.9*  HCT 35.4*  PLT 259    Recent Labs Lab 03/14/14 1035  NA 139  K 4.0  CL 103  CO2 24  BUN 8  CREATININE 0.61  GLUCOSE 94  CALCIUM 8.5     CXR 03/14/14 IMPRESSION:  No edema or consolidation.   itrop neg  Charlane Ferretti, MD 03/14/2014, 3:34 PM PGY-2, [facility] FPTS Intern pager: [phone], text pages welcome

## 2014-03-15 ENCOUNTER — Encounter (HOSPITAL_COMMUNITY): Payer: Self-pay | Admitting: General Practice

## 2014-03-15 ENCOUNTER — Observation Stay (HOSPITAL_COMMUNITY): Payer: Medicare Other

## 2014-03-15 DIAGNOSIS — R002 Palpitations: Secondary | ICD-10-CM | POA: Diagnosis not present

## 2014-03-15 DIAGNOSIS — IMO0001 Reserved for inherently not codable concepts without codable children: Secondary | ICD-10-CM

## 2014-03-15 DIAGNOSIS — R072 Precordial pain: Secondary | ICD-10-CM

## 2014-03-15 DIAGNOSIS — R55 Syncope and collapse: Secondary | ICD-10-CM

## 2014-03-15 LAB — VITAMIN B12: VITAMIN B 12: 745 pg/mL (ref 211–911)

## 2014-03-15 LAB — HEMOGLOBIN A1C
Hgb A1c MFr Bld: 6.3 % — ABNORMAL HIGH (ref <5.7)
MEAN PLASMA GLUCOSE: 134 mg/dL — AB (ref <117)

## 2014-03-15 LAB — HIV ANTIBODY (ROUTINE TESTING W REFLEX): HIV: NONREACTIVE

## 2014-03-15 LAB — TSH: TSH: 1.32 u[IU]/mL (ref 0.350–4.500)

## 2014-03-15 LAB — TROPONIN I

## 2014-03-15 LAB — FOLATE RBC: RBC FOLATE: 717 ng/mL — AB (ref >280)

## 2014-03-15 LAB — RPR

## 2014-03-15 MED ORDER — ONDANSETRON 4 MG PO TBDP
4.0000 mg | ORAL_TABLET | Freq: Once | ORAL | Status: AC
  Administered 2014-03-15: 4 mg via ORAL
  Filled 2014-03-15: qty 1

## 2014-03-15 MED ORDER — AMITRIPTYLINE HCL 25 MG PO TABS
25.0000 mg | ORAL_TABLET | Freq: Every day | ORAL | Status: DC
  Filled 2014-03-15: qty 1

## 2014-03-15 MED ORDER — PANTOPRAZOLE SODIUM 40 MG PO TBEC
40.0000 mg | DELAYED_RELEASE_TABLET | Freq: Every day | ORAL | Status: DC
  Administered 2014-03-16: 40 mg via ORAL
  Filled 2014-03-15: qty 1

## 2014-03-15 MED ORDER — STROKE: EARLY STAGES OF RECOVERY BOOK
Freq: Once | Status: AC
  Administered 2014-03-16: 06:00:00
  Filled 2014-03-15: qty 1

## 2014-03-15 MED ORDER — LORAZEPAM 2 MG/ML IJ SOLN
1.0000 mg | Freq: Once | INTRAMUSCULAR | Status: AC
  Administered 2014-03-15: 1 mg via INTRAVENOUS
  Filled 2014-03-15: qty 1

## 2014-03-15 MED ORDER — DULOXETINE HCL 30 MG PO CPEP
30.0000 mg | ORAL_CAPSULE | Freq: Every day | ORAL | Status: DC
  Administered 2014-03-15 – 2014-03-16 (×2): 30 mg via ORAL
  Filled 2014-03-15 (×2): qty 1

## 2014-03-15 MED ORDER — ONDANSETRON HCL 4 MG/2ML IJ SOLN: 4.0000 mg | Freq: Once | INTRAMUSCULAR | Status: DC

## 2014-03-15 MED ORDER — ACETAMINOPHEN 325 MG PO TABS
650.0000 mg | ORAL_TABLET | Freq: Four times a day (QID) | ORAL | Status: DC | PRN
  Administered 2014-03-15 – 2014-03-16 (×3): 650 mg via ORAL
  Filled 2014-03-15 (×3): qty 2

## 2014-03-15 NOTE — Progress Notes (Signed)
  Echocardiogram 2D Echocardiogram has been performed.  Terry Ball, Terry Ball 03/15/2014, 9:58 AM

## 2014-03-15 NOTE — Evaluation (Signed)
Physical Therapy Evaluation/ Discharge Patient Details Name: Terry Ball MRN: [id number] DOB: [birth year] Today's Date: 03/15/2014   History of Present Illness  Pt admitted with chest palpitations and falls  Clinical Impression  Pt very pleasant and reports at least 5 falls in the last year. Pt states no dizziness or knee buckling occurred with falls but states she is just clumsy. Pt with mild balance deficits with single limb stance and recommend pt be seated for dressing at all times, use long-handled sponge in the shower, continue exercise at the gym as well as provided HEP, install rail and home and have hand held support for single limb stance. Pt states understanding of all education provided and should be implement this education and continue to fall would recommend OPPT. No further acute needs as all education provided and pt at baseline with smooth gait even with head turns, change of direction and speed.     Follow Up Recommendations Outpatient PT    Equipment Recommendations  None recommended by PT    Recommendations for Other Services       Precautions / Restrictions Precautions Precautions: Fall      Mobility  Bed Mobility Overal bed mobility: Modified Independent                Transfers Overall transfer level: Independent                  Ambulation/Gait Ambulation/Gait assistance: Modified independent (Device/Increase time) Ambulation Distance (Feet): 250 Feet Assistive device: None Gait Pattern/deviations: Step-through pattern;Decreased stride length   Gait velocity interpretation: Below normal speed for age/gender    Stairs Stairs: Yes Stairs assistance: Modified independent (Device/Increase time) Stair Management: No rails;Alternating pattern;Forwards Number of Stairs: 3    Wheelchair Mobility    Modified Rankin (Stroke Patients Only)       Balance                                 Standardized Balance  Assessment Standardized Balance Assessment : Berg Balance Test Berg Balance Test Sit to Stand: Able to stand without using hands and stabilize independently Standing Unsupported: Able to stand safely 2 minutes Sitting with Back Unsupported but Feet Supported on Floor or Stool: Able to sit safely and securely 2 minutes Stand to Sit: Sits safely with minimal use of hands Transfers: Able to transfer safely, minor use of hands Standing Unsupported with Eyes Closed: Able to stand 10 seconds safely Standing Ubsupported with Feet Together: Able to place feet together independently and stand 1 minute safely From Standing, Reach Forward with Outstretched Arm: Can reach confidently >25 cm (10") From Standing Position, Pick up Object from Floor: Able to pick up shoe safely and easily From Standing Position, Turn to Look Behind Over each Shoulder: Turn sideways only but maintains balance Turn 360 Degrees: Able to turn 360 degrees safely in 4 seconds or less Standing Unsupported, Alternately Place Feet on Step/Stool: Able to stand independently and safely and complete 8 steps in 20 seconds Standing Unsupported, One Foot in Front: Able to take small step independently and hold 30 seconds Standing on One Leg: Able to lift leg independently and hold 5-10 seconds Total Score: 51         Pertinent Vitals/Pain Pain Assessment: No/denies pain HR 89    Home Living Family/patient expects to be discharged to:: Private residence Living Arrangements: Alone Available Help at Discharge: Family;Available PRN/intermittently Type of Home: House  Home Access: Stairs to enter Entrance Stairs-Rails: None (rail to be installed this weekend) Technical brewer of Steps: 3 Home Layout: One level Home Equipment: None      Prior Function Level of Independence: Independent               Hand Dominance        Extremity/Trunk Assessment   Upper Extremity Assessment: Overall WFL for tasks assessed            Lower Extremity Assessment: Overall WFL for tasks assessed      Cervical / Trunk Assessment: Normal  Communication   Communication: No difficulties  Cognition Arousal/Alertness: Awake/alert Behavior During Therapy: WFL for tasks assessed/performed Overall Cognitive Status: Within Functional Limits for tasks assessed                      General Comments      Exercises General Exercises - Lower Extremity Hip Flexion/Marching: AROM;Seated;Both;10 reps Toe Raises: AROM;Seated;Both;10 reps      Assessment/Plan    PT Assessment All further PT needs can be met in the next venue of care  PT Diagnosis Difficulty walking   PT Problem List Decreased balance  PT Treatment Interventions     PT Goals (Current goals can be found in the Care Plan section) Acute Rehab PT Goals PT Goal Formulation: No goals set, d/c therapy    Frequency     Barriers to discharge        Co-evaluation               End of Session Equipment Utilized During Treatment: Gait belt Activity Tolerance: Patient tolerated treatment well Patient left: in chair;with call bell/phone within reach;with family/visitor present Nurse Communication: Mobility status         Time: 1030-1050 PT Time Calculation (min): 20 min   Charges:   PT Evaluation $Initial PT Evaluation Tier I: 1 Procedure PT Treatments $Therapeutic Exercise: 8-22 mins   PT G Codes:          Melford Aase 03/15/2014, 11:32 AM Elwyn Reach, [place]

## 2014-03-15 NOTE — Progress Notes (Signed)
Utilization review completed. Naszir Cott, RN, BSN. 

## 2014-03-15 NOTE — Consult Note (Signed)
Cardiology Consultation Note  Patient ID: Terry Ball, MRN: [id number], DOB/AGE: 64/12/1950 64 y.o. Admit date: 03/14/2014   Date of Consult: 03/15/2014 Primary Physician: [name], DAVID C, MD Primary Cardiologist: New to [facility]  Chief Complaint: Palpitations and SOB Reason for Consult: Palpitations and SOB  HPI: 63 year old female with history of reported SVT 15 years ([name]), TIAs, and hyperlipidemia who [facility] after developing palpitations with associated chest tightness around 7:30 AM lasting approximately 15-20 minutes. She was checking her pulse feeling 2-4 beats at a time, then a skipped beat. She takes Atenolol 50 mg bid regularly for her previously diagnosed SVT so she went ahead and took an extra 50 mg tab this AM for a total of 100 mg. She dialed 911, but set them home upon arrival 2/2 her symptoms had resolved. She reports approximately 30 minutes later her palpitations returned prompting her to again dial 911, this time coming to the hospital. Her chest tightness is characterized as substernal and does not radiate. No associated nausea or vomiting. No diaphoresis.  Of note, she has suffered quite a lot of falls lately dating back to the beginning of March. Some of these are falls in which she has fallen off to the side and others are her falling backwards. She also had a fall back in March where she hit her head pretty good. No medical care was sought. She is unaware of any palpitations during those events. She also denies any seizure-like activity. No loss of bowel or bladder function. Denies syncope. She is a fairly active person at home, even mows the lawn. Some increase in SOB lately. Sleeps with one pillow. Some early satiety. No LEE.  Her daughter, who is a critical care nurse here at [facility], reports the patient had a negative nuclear treadmill [facility].   She has been very stable overnight. Telemetry monitor  has not revealed any significant episodes of tachycardia or bradycardia.  Past Medical History  Diagnosis Date  . Fibromyalgia   . High cholesterol   . Tachycardia     SV[facility]-diagnosed by [facility] 2000  . GERD (gastroesophageal reflux disease)   . Normal cardiac stress test 2013      Most Recent Cardiac Studies: See above for reported studies from [facility].    Surgical History:  Past Surgical History  Procedure Laterality Date  . Shoulder surgery         Allergies:  Allergies  Allergen Reactions  . Codeine     REACTION: rash, itching  . Morphine And Related Itching    + nausea  . Sulfa Antibiotics Nausea Only    History   Social History  . Marital Status: Widowed    Spouse Name: [name]    Number of Children: [name]  . Years of Education: [name]   Occupational History  . Not on file.   Social History Main Topics  . Smoking status: Former Smoker -- 1.00 packs/day for 30 years    Types: Cigarettes    Quit date: 03/14/2012  . Smokeless tobacco: Never Used  . Alcohol Use: Yes     Comment: occasional  . Drug Use: No  . Sexual Activity: Not on file   Other Topics Concern  . Not on file   Social History Narrative  . No narrative on file     Family History  Problem Relation Age of Onset  . CAD Brother   .  CAD Paternal Grandfather      Labs:  Recent Labs  03/14/14 1700 03/14/14 2305 03/15/14 0540  TROPONINI <0.30 <0.30 <0.30   Lab Results  Component Value Date   WBC 7.6 03/14/2014   HGB 11.9* 03/14/2014   HCT 35.4* 03/14/2014   MCV 91.0 03/14/2014   PLT 259 03/14/2014     Recent Labs Lab 03/14/14 1035  NA 139  K 4.0  CL 103  CO2 24  BUN 8  CREATININE 0.61  CALCIUM 8.5  PROT 6.3  BILITOT 0.2*  ALKPHOS 82  ALT 15  AST 16  GLUCOSE 94   Lab Results  Component Value Date   CHOL 139 03/14/2014   No results found for this basename: DDIMER    Radiology/Studies:  Dg Chest 2 View  03/14/2014   CLINICAL DATA:  Shortness of breath and  cardiac palpitations  EXAM: CHEST  2 VIEW  COMPARISON:  March 16, 2012  FINDINGS: There is no edema or consolidation. Heart size and pulmonary vascularity are normal. No adenopathy. No bone lesions. A previously noted somewhat vague nodular opacity in the right upper lobe is no longer appreciable.  IMPRESSION: No edema or consolidation.   Electronically Signed   By: Bretta Bang M.D.   On: 03/14/2014 11:05    EKG: NSR, 70, low voltage precordial leads, no st/t changes  Physical Exam: Blood pressure 102/62, pulse 86, temperature 97.7 F (36.5 C), temperature source Axillary, resp. rate 18, SpO2 97.00%. General: Well developed, well nourished, in no acute distress. Head: Normocephalic, atraumatic, sclera non-icteric, no xanthomas, nares are without discharge.  Neck: Negative for carotid bruits. JVD not elevated. Lungs: Clear bilaterally to auscultation without wheezes, rales, or rhonchi. Breathing is unlabored. Heart: RRR with S1 S2. No murmurs, rubs, or gallops appreciated. Abdomen: Soft, non-tender, non-distended with normoactive bowel sounds. No hepatomegaly. No rebound/guarding. No obvious abdominal masses. Msk:  Strength and tone appear normal for age. Extremities: No clubbing or cyanosis. No edema.  Distal pedal pulses are 2+ and equal bilaterally. Multiple late stage bruising of the bilateral legs.  Neuro: Alert and oriented X 3. Very slight right sided lip droop. No focal deficit. Moves all extremities spontaneously. 5/5 strength. Slight increase sensation of the right-side face.  Psych:  Responds to questions appropriately with a normal affect.     Assessment and Plan: 63 year old female with history of reported SVT 15 years ago, TIAs, and hyperlipidemia who presented to [facility] ED today with palpitations, SOB, and multiple falls.  1) Palpitations: She reports a history of SVT/palpitations approximately 15 years ago diagnosed by [facility]. She was placed on Atenolol  50 mg bid for this at that time and his been on this ever since.   She's felt very well since being in the hospital. She's not had any significant episodes of palpitations. In addition, the telemetry monitor does not reveal any arrhythmias.  She should be able to be discharged soon.   Will have her get an event monitor at our office next week.  2) Chest tightness: She has a family history significant of CAD. Troponins are negative.  3) Multiple falls:  Several others but no evidence of any significant arrhythmias to explain her falls. I would  Suggest a  neuro workup.  4) Hyperlipidemia: On statin.         Vesta Mixer, Montez Hageman., MD, [facility] 03/15/2014, 10:41 AM [address]  [address] Office [phone] Pager [phone]

## 2014-03-15 NOTE — Progress Notes (Signed)
Seen and examined.  Agree with the documentation and management of Dr. Leonides Schanzorsey.  See my co sign of the H&PE for my note of today.

## 2014-03-15 NOTE — Discharge Summary (Signed)
Family Medicine Teaching [facility] Discharge Summary  Patient name: Terry Ball Medical record number: [id number] Date of birth: 02/23/1951 Age: 63 y.o. Gender: female Date of Admission: 03/14/2014  Date of Discharge: 03/16/2014 Admitting Physician: Sanjuana LettersWilliam Arthur Hensel, MD  Primary Care Provider: [name], DAVID C, MD Consultants: Cardiology  Indication for Hospitalization: Chest palpitations and history of falls  Discharge Diagnoses/Problem List:  Chest palpitations History of falls Fibromyalgia  GERD Hyperlipidemia  Disposition: Discharge Home  Discharge Condition: Stable  Discharge Exam: See Progress Note from same day  Brief Hospital Course:  Terry Ball is 63 y.o. female presenting with 2 episodes of heart palpitations lasting 15 minutes and increased falls over the last 6 months with a PMH significant for SVT, TIA, hyperlipidemia. In the ED, she was noted to have negative POCT troponin, K+ 4.0, hgb 11.9, CXR with no edema or consolidation, BP running between 106/51-117/54 with a pulse of 84. EKG was NSR with no signs of ischemia/infarction. CT noted mild chronic small vessel disease but not acute pathology. Cardiology was consulted in the ED who felt she should be evaluated for possible arrhthymias.   She was admitted for further work-up and placed on telemetry. No events were noted overnight. Troponins were negative x 3.  UDS was negative, Vitamin B12 and TSH were within normal limits. Echocardiogram noted an EF of 55-60%. Carotid dopplers did not reveal a significant carotid stenosis. Cardiology felt her palpitations and falls were most likely not cardiac in etiology, but planned to set her up for an outpatient event monitor.  Falls: Unsure if this was related to the patient's chest palpitations or some other etiology. After a review of her home medications, both Elavil and her hormone replacement were discontinued (as Elavil is on the [place] list and can cause falls, and  the patient has a h/o TIA). Cymbalta was started to address fibromyalgia, depression, and menopausal symptoms. Due to reports of facial droop in the past by her daughters, an MRI was obtained which showed no acute abnormalities (just mild chronic small vessel ischemic disease). PT and OT evaluated patient, and recommended just outpatient physical therapy, so patient was given a prescription for this upon discharge.  Issues for Follow Up:  -Continued outpatient PT -Outpatient Cardiology f/u for event monitor  Significant Procedures: None  Significant Labs and Imaging:   Recent Labs Lab 03/14/14 1035  WBC 7.6  HGB 11.9*  HCT 35.4*  PLT 259    Recent Labs Lab 03/14/14 1035  NA 139  K 4.0  CL 103  CO2 24  GLUCOSE 94  BUN 8  CREATININE 0.61  CALCIUM 8.5  ALKPHOS 82  AST 16  ALT 15  ALBUMIN 3.3*  Troponin neg x 3  Ethyl Alcohol <11  UDS neg  RPR neg  HIV neg  Vit B-12: 745  CXR 8/13: No edema or consolidation.   CT head 8/13: No acute intracranial process. Mild chronic small vessel ischemic disease.   Carotid dopplers:  1-39% stenosis of the R ICA and L ICA.  Results/Tests Pending at Time of Discharge: None  Discharge Medications:    Medication List    STOP taking these medications       amitriptyline 25 MG tablet  Commonly known as:  ELAVIL     estradiol 0.5 MG tablet  Commonly known as:  ESTRACE     medroxyPROGESTERone 2.5 MG tablet  Commonly known as:  PROVERA      TAKE these medications  atenolol 50 MG tablet  Commonly known as:  TENORMIN  Take 50 mg by mouth 2 (two) times daily.     CLARITIN 10 MG Caps  Generic drug:  Loratadine  Take 10 mg by mouth daily as needed.     DULoxetine 30 MG capsule  Commonly known as:  CYMBALTA  Take 1 capsule (30 mg total) by mouth daily.     ipratropium 0.06 % nasal spray  Commonly known as:  ATROVENT  Place 2 sprays into both nostrils 4 (four) times daily.     lamoTRIgine 200 MG tablet  Commonly  known as:  LAMICTAL  Take 200 mg by mouth at bedtime.     multivitamin tablet  Take 1 tablet by mouth daily.     naproxen sodium 220 MG tablet  Commonly known as:  ANAPROX  Take 220 mg by mouth 2 (two) times daily with a meal.     omeprazole 40 MG capsule  Commonly known as:  PRILOSEC  Take 40 mg by mouth 2 (two) times daily.     risperiDONE 0.5 MG tablet  Commonly known as:  RISPERDAL  Take 0.5 mg by mouth 2 (two) times daily.     simvastatin 40 MG tablet  Commonly known as:  ZOCOR  Take 40 mg by mouth at bedtime.     valACYclovir 500 MG tablet  Commonly known as:  VALTREX  Take 500 mg by mouth daily.     vitamin C 500 MG tablet  Commonly known as:  ASCORBIC ACID  Take 500 mg by mouth daily.        Discharge Instructions: Please refer to Patient Instructions section of EMR for full details.  Patient was counseled important signs and symptoms that should prompt return to medical care, changes in medications, dietary instructions, activity restrictions, and follow up appointments.   Follow-Up Appointments: Follow-up Information   Follow up with [facility]. (you will be contacted. if you have not been called within 1 week, please call the office.)    Contact information:   [address] [place] [address] [phone]      Follow up with TALBOT, DAVID C, MD. Schedule an appointment as soon as possible for a visit in 5 days. (for hospital follow up)    Specialty:  Internal Medicine   Contact information:   [address] [place] [address] [phone]       [name], MD   03/18/2014, 12:01 PM PGY-3, [facility]

## 2014-03-15 NOTE — Progress Notes (Signed)
OT Cancellation Note  Patient Details Name: Terry DamaDeborah Ball MRN: [id number] DOB: 09/10/1950   Cancelled Treatment:    Reason Eval/Treat Not Completed: Patient at procedure or test/ unavailable. OT to continue to follow and reatempt at later time.  Harolyn RutherfordJones, Eylin Pontarelli B Pager: [phone]  03/15/2014, 10:59 AM

## 2014-03-15 NOTE — Progress Notes (Signed)
Family Medicine Teaching Service Daily Progress Note Intern Pager: [phone]  Patient name: Terry Ball Medical record number: [id number] Date of birth: [birth year] Age: 63 y.o. Gender: female  Primary Care Provider: Mia Creek, MD Consultants: cardiology  Code Status: Full  Pt Overview and Major Events to Date:  8/13: Presenting with chest palpitations and a history of falls  Assessment and Plan: Terry Ball is a 63 y.o. female presenting with 2 episodes of heart palpitations lasting 15 minutes and increased falls recently. PMH is significant for SVT, TIA, hyperlipidemia   Chest palpitations- EKG on admission was NSR with no evidence of ischemia or infarction. Neg itrop, no assc syncopal episodes.  No signs of electrolyte abnormalities; Questionable paroxysmal arrythmia? Vs sinus pause vs valvular dysfunction vs angina (Did have a stress test at [facility] several months ago that was apparently normal but has family history significant for CAD). S/p cards eval in the ED  -on telemetry: No abnormalities O/N -2D echo  -vitals per floor protocol  -potentially will need Loop vs event monitor  -cards following, appreciate recs   Falls- in setting of prior TIA; questionably related to cardiac etiology, ddx includes TIA vs cerebellar pathology (given visual issues and imbalance) vs arrythmia (in light of prior SVT?) vs vertigo vs vestibular imbalance (does have a hx of vertigo, but having falls forward as well as backward) vs subclinical seizures(no assc seizure like activity with episodes? but appears that these are largely unwitnessed). This could be 2/2 Elavil. - Discontinue Elavil  -CT head with/without unremarkable  - Will get MRI -carotid doppler  - consider neuro c/s pending MRI results  -fall precautions, up with assistance   Fibromyalgia- chronic; on elavil as outpatient - Elavil on [facility] list and could be causing falls. Given low dose, no need to taper - No Elavil  on discharge! - Will start Cymbalta which may also be beneficial for depression and hot flashes  Bipolar?- on lamictal and risperidal? Has anxious/unusual affect, although very pleasant  -will attempt to clarify hx further with patient  -may benefit from MSE   GERD- taking NSAIDs in addition to twice daily PPIs  -hosp formulary protonix here   Hyperlipidemia- on statin  -continue   Post-menopausal: Patient has been on estrogen therapy x 10-20 years - will NOT re-start estrogen replacement given h/o TIA and concerns for stroke. - will NOT discharge on estrogen, Hopefully Cymbalta will be effective.   FEN/GI: KVO; gen diet  Prophylaxis: Heparin SQ   Disposition: D/c most likely tomorrow or Sunday.  Subjective:  Patient doing well overnight. Still   Objective: Temp:  [97.8 F (36.6 C)-98.7 F (37.1 C)] 98.2 F (36.8 C) (08/14 0600) Pulse Rate:  [69-84] 83 (08/14 0600) Resp:  [12-24] 20 (08/14 0600) BP: (92-128)/(42-71) 118/50 mmHg (08/14 0600) SpO2:  [96 %-100 %] 98 % (08/14 0600) Physical Exam: General: anxious affect; sitting up eating lunch HEENT: Atraumatic. Normocephalic. 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  No clonus present; Neg rhomberg, no pronator drift. 5/5 strength in the upper and lower extremity b/l. Gait not assessed.   Laboratory:  Recent Labs Lab 03/14/14 1035  WBC 7.6  HGB 11.9*  HCT 35.4*  PLT 259    Recent Labs Lab 03/14/14 1035  NA 139  K 4.0  CL 103  CO2 24  BUN 8  CREATININE 0.61  CALCIUM 8.5  PROT 6.3  BILITOT 0.2*  ALKPHOS 82  ALT 15  AST 16  GLUCOSE 94  Troponin neg x 3 Ethyl Alcohol <11 UDS neg RPR neg HIV neg Vit B-12: 745  Risk Stratification Labs  TSH    Component Value Date/Time   TSH 1.320  03/15/2014 0540   Hemoglobin A1C    Component Value Date/Time   HGBA1C 6.3* 03/14/2014 1921   Lipid Panel     Component Value Date/Time   CHOL 139 03/14/2014 1921   TRIG 128 03/14/2014 1921   HDL 52 03/14/2014 1921   CHOLHDL 2.7 03/14/2014 1921   VLDL 26 03/14/2014 1921   LDLCALC 61 03/14/2014 1921     Imaging/Diagnostic Tests: CXR 8/13: No edema or consolidation.  CT head 8/13: No acute intracranial process. Mild chronic small vessel ischemic disease.    Terry Puffrystal S Juergen Hardenbrook, MD 03/15/2014, 6:43 AM PGY-1, [facility] FPTS Intern pager: [phone], text pages welcome

## 2014-03-15 NOTE — H&P (Signed)
Seen and examined.  Agree with the documentation and management of Dr. Michail JewelsMarsh.  Complex patient.  Briefly, 1. Frequent falls, multifactorial.  Absolutely needs Elavil DCed.  Will get pt eval.  Maybe neuro. 2. Rt sided weakness seems more prominent today.  Will check MRI of brain.   3. Psych. Quite anxious.  On multiple meds.  I am unsure how much contributing to somatic complaints.   4. Chest tightness.  Recent nuclear med stress test.  Ruling out.  Absolutely need DC of post menopausal estrogen.  Appreciate cards help

## 2014-03-15 NOTE — Evaluation (Signed)
Occupational Therapy Evaluation Patient Details Name: Terry Ball MRN: [id number] DOB: 06/30/1951 Today'Stefani Damas Date: 03/15/2014    History of Present Illness Pt admitted with chest palpitations and falls   Clinical Impression   Patient evaluated by Occupational Therapy with no further acute OT needs identified. All education has been completed and the patient has no further questions. See below for any follow-up Occupational Therapy or equipment needs. OT to sign off. Thank you for referral.      Follow Up Recommendations  No OT follow up    Equipment Recommendations  None recommended by OT    Recommendations for Other Services       Precautions / Restrictions Precautions Precautions: Fall      Mobility Bed Mobility Overal bed mobility: Modified Independent                Transfers Overall transfer level: Modified independent                    Balance                                            ADL Overall ADL's : Modified independent                                       General ADL Comments: Pt educated on energy consrevation and provided handout. pt educated on use of DME for shower transfers and purchase of grab bars. Pt ambulated > 100 ft with change of direction and walking backwards.      Vision                     Perception     Praxis      Pertinent Vitals/Pain       Hand Dominance Right   Extremity/Trunk Assessment Upper Extremity Assessment Upper Extremity Assessment: Overall WFL for tasks assessed   Lower Extremity Assessment Lower Extremity Assessment: Overall WFL for tasks assessed   Cervical / Trunk Assessment Cervical / Trunk Assessment: Normal   Communication Communication Communication: No difficulties   Cognition Arousal/Alertness: Awake/alert Behavior During Therapy: WFL for tasks assessed/performed Overall Cognitive Status: Within Functional Limits for tasks assessed                     General Comments       Exercises       Shoulder Instructions      Home Living Family/patient expects to be discharged to:: Private residence Living Arrangements: Alone Available Help at Discharge: Family;Available PRN/intermittently Type of Home: House Home Access: Stairs to enter Entergy CorporationEntrance Stairs-Number of Steps: 3 Entrance Stairs-Rails: None Home Layout: One level     Bathroom Shower/Tub: Producer, television/film/videoWalk-in shower   Bathroom Toilet: Standard     Home Equipment: None          Prior Functioning/Environment Level of Independence: Independent             OT Diagnosis:     OT Problem List:     OT Treatment/Interventions:      OT Goals(Current goals can be found in the care plan section)    OT Frequency:     Barriers to D/C:            Co-evaluation  End of Session Nurse Communication: Mobility status;Precautions  Activity Tolerance: Patient tolerated treatment well Patient left: in bed;with call bell/phone within reach;with family/visitor present   Time: 1500-1515 OT Time Calculation (min): 15 min Charges:  OT General Charges $OT Visit: 1 Procedure OT Evaluation $Initial OT Evaluation Tier I: 1 Procedure OT Treatments $Self Care/Home Management : 8-22 mins G-Codes: OT G-codes **NOT FOR INPATIENT CLASS** Functional Assessment Tool Used: clinical judgement Functional Limitation: Self care Self Care Current Status (R6045): 0 percent impaired, limited or restricted Self Care Goal Status (W0981): 0 percent impaired, limited or restricted Self Care Discharge Status (X9147): 0 percent impaired, limited or restricted  Boone Master B 03/15/2014, 3:51 PM Pager: [phone]

## 2014-03-15 NOTE — Progress Notes (Signed)
PT Cancellation Note  Patient Details Name: Stefani DamaDeborah Needham MRN: [id number] DOB: 05/11/1951   Cancelled Treatment:    Reason Eval/Treat Not Completed: Patient at procedure or test/unavailable (pt at vascular lab and unavailable)   Toney Sangabor, Alethia Melendrez Beth 03/15/2014, 9:59 AM Delaney MeigsMaija Tabor Kei Mcelhiney, PT [phone]

## 2014-03-15 NOTE — Care Management Note (Addendum)
    Page 1 of 1   03/16/2014     1:56:45 PM CARE MANAGEMENT NOTE 03/16/2014  [facility]:  [facility]   Account Number:  [id number]  Date Initiated:  03/15/2014  Documentation initiated by:  Donn PieriniWEBSTER,KRISTI  Subjective/Objective Assessment:   Pt admitted with palpitations and SOB     Action/Plan:   PTA pt lived at home   Anticipated DC Date:  03/16/2014   Anticipated DC Plan:  HOME/SELF CARE      DC Planning Services  CM consult      Choice offered to / List presented to:     DME arranged  3-N-1  C[facility]      DME agency  [facility]        Status of service:  Completed, signed off Medicare Important Message given?  YES (If response is "NO", the following Medicare IM given date fields will be blank) Date Medicare IM given:  03/14/2014 Medicare IM given by:   Date Additional Medicare IM given:   Additional Medicare IM given by:    Discharge Disposition:  HOME/SELF CARE  Per UR Regulation:  Reviewed for med. necessity/level of care/duration of stay  If discussed at Long Length of Stay Meetings, dates discussed:    Comments:  03/16/14 13:00 CM received call from RN requesting DME.  CM called DME delivery rep to deliver cane and 3n1 to room prior toi discharge.  No other CM needs were communicated. Freddy JakschSarah Tomoya Ringwald, BSN, [place]CM [phone].  03/15/14- 1400- Donn PieriniKristi Webster RN, BSN [phone] Medicare CC44 delivered to pt/family- signed copy placed in shadow chart.

## 2014-03-15 NOTE — ED Provider Notes (Signed)
Medical screening examination/treatment/procedure(s) were conducted as a shared visit with non-physician practitioner(s) and myself.  I personally evaluated the patient during the encounter.   EKG Interpretation   Date/Time:  Thursday March 14 2014 10:23:46 EDT Ventricular Rate:  70 PR Interval:  171 QRS Duration: 86 QT Interval:  390 QTC Calculation: 421 R Axis:   24 Text Interpretation:  Sinus rhythm Low voltage, precordial leads No  previous ECGs available Confirmed by YAO  MD, DAVID ([id number]) on 03/14/2014  10:27:58 AM      Terry Ball is a 63 y.o. female hx of HL, firbomyalgia here with chest pain. Acute onset of chest pain this morning when she was in the kitchen. Hasn't seen cardiology in 15 years. Also has been feeling chronically dizzy and has several episodes of falls but no syncope. On exam, vitals stable. Heart, lung, abdomen exam unremarkable. Called cardiology, who recommend medical admission for further workup.    Richardean Canalavid H Yao, MD 03/15/14 [phone]

## 2014-03-16 DIAGNOSIS — E7800 Pure hypercholesterolemia, unspecified: Secondary | ICD-10-CM

## 2014-03-16 DIAGNOSIS — R002 Palpitations: Secondary | ICD-10-CM | POA: Diagnosis not present

## 2014-03-16 MED ORDER — DULOXETINE HCL 30 MG PO CPEP: 30.0000 mg | ORAL_CAPSULE | Freq: Every day | ORAL | Status: DC

## 2014-03-16 NOTE — Progress Notes (Signed)
Seen and examined.  Agree with the management and documentation of Dr. Pollie MeyerMcIntyre.  Ms Terry Ball is apprehensive, still feels dizzy and unsteady but is definitely improved.  OK to DC today with event monitor as an outpatient.  No more elavil or post menopausal hormones.

## 2014-03-16 NOTE — Discharge Instructions (Signed)
You were admitted to the hospital with heart palpitations and falls. You will need physical therapy as an outpatient. Work with your primary doctor to set this up, or use the written prescription that has been provided to you. The cardiology office will be in touch with you about setting up the event monitor. If you haven't heard from them within a week, call their office to ask. The phone number is listed below.  You will need to follow up with your primary doctor this coming week for a hospital follow up appointment.   What to do after you leave the hospital: Recommended diet: regular diet Recommended activity: activity as tolerated  Please seek medical attention if you experience any chest pain, shortness of breath, fevers, passing out, or any other concerns.

## 2014-03-16 NOTE — Progress Notes (Signed)
Family Medicine Teaching Service Daily Progress Note Intern Pager: [phone]  Patient name: Terry Ball Medical record number: [id number] Date of birth: 05/05/1951 Age: 63 y.o. Gender: female  Primary Care Provider: [name], DAVID C, MD Consultants: cardiology  Code Status: Full  Pt Overview and Major Events to Date:  8/13: Presenting with chest palpitations and a history of falls  Assessment and Plan: Terry Ball is a 63 y.o. female presenting with 2 episodes of heart palpitations lasting 15 minutes and increased falls recently. PMH is significant for SVT, TIA, hyperlipidemia   Chest palpitations- EKG on admission was NSR with no evidence of ischemia or infarction. Neg itrop, no assc syncopal episodes.  No signs of electrolyte abnormalities; Questionable paroxysmal arrythmia? Vs sinus pause vs valvular dysfunction vs angina (Did have a stress [facility] several months ago that was apparently normal but has family history significant for CAD). S/p cards eval in the ED  -on telemetry: no recorded events overnight -2D echo normal -vitals per floor protocol  -cardiology following, planning for outpatient event monitor  Falls- in setting of prior TIA; questionably related to cardiac etiology, ddx includes TIA vs cerebellar pathology (given visual issues and imbalance) vs arrythmia (in light of prior SVT?) vs vertigo vs vestibular imbalance (does have a hx of vertigo, but having falls forward as well as backward) vs subclinical seizures(no assc seizure like activity with episodes? but appears that these are largely unwitnessed). This could be 2/2 Elavil. - Discontinue Elavil  -CT head with/without unremarkable, MRI unremarkable -carotid doppler without significant carotid stenosis -fall precautions, up with assistance  -PT & OT - needs outpatient PT, script written and put in chart  Fibromyalgia- chronic; on elavil as outpatient - Elavil on [facility] list and could be causing  falls. Given low dose, no need to taper - No Elavil on discharge! - have started Cymbalta which may also be beneficial for depression and hot flashes  Bipolar?- on lamictal and risperidal? Has anxious/unusual affect, although very pleasant  -consider attempting to clarify hx further with patient -may benefit from MSE   GERD- taking NSAIDs in addition to twice daily PPIs  -hosp formulary protonix here   Hyperlipidemia- on statin  -continue   Post-menopausal: Patient has been on estrogen therapy x 10-20 years - will NOT re-start estrogen replacement given h/o TIA and concerns for stroke. - will NOT discharge on estrogen, Hopefully Cymbalta will be effective.   FEN/GI: KVO; gen diet  Prophylaxis: Heparin SQ  Disposition: d/c home today with outpatient PT and cardiology follow up for event monitor  Subjective:  No acute events overnight. Pt feeling well this morning, feels ready to go home  Objective: Temp:  [97.4 F (36.3 C)-98.8 F (37.1 C)] 98.8 F (37.1 C) (08/15 0400) Pulse Rate:  [73-95] 88 (08/15 0400) Resp:  [18] 18 (08/15 0400) BP: (98-113)/(24-69) 98/59 mmHg (08/15 0400) SpO2:  [96 %-100 %] 96 % (08/15 0400) Weight:  [163 lb 1.6 oz (73.982 kg)] 163 lb 1.6 oz (73.982 kg) (08/15 0400) Physical Exam: General: NAD, sitting up in bed HEENT: Atraumatic. Normocephalic. Cardiovascular: RRR, no m/r/g  Respiratory: CTAB, normal WOB  Abdomen: soft, NT/ND Extremities: No appreciable lower extremity edema bilaterally  Skin: No signs of skin break down or rash Neuro: grossly nonfocal, speech normal  Laboratory:  Recent Labs Lab 03/14/14 1035  WBC 7.6  HGB 11.9*  HCT 35.4*  PLT 259    Recent Labs Lab 03/14/14 1035  NA 139  K 4.0  CL 103  CO2 24  BUN 8  CREATININE 0.61  CALCIUM 8.5  PROT 6.3  BILITOT 0.2*  ALKPHOS 82  ALT 15  AST 16  GLUCOSE 94  Troponin neg x 3 Ethyl Alcohol <11 UDS neg RPR neg HIV neg Vit B-12: 745  Risk Stratification  Labs  TSH    Component Value Date/Time   TSH 1.320 03/15/2014 0540   Hemoglobin A1C    Component Value Date/Time   HGBA1C 6.3* 03/14/2014 1921   Lipid Panel     Component Value Date/Time   CHOL 139 03/14/2014 1921   TRIG 128 03/14/2014 1921   HDL 52 03/14/2014 1921   CHOLHDL 2.7 03/14/2014 1921   VLDL 26 03/14/2014 1921   LDLCALC 61 03/14/2014 1921     Imaging/Diagnostic Tests: CXR 8/13: No edema or consolidation.  CT head 8/13: No acute intracranial process. Mild chronic small vessel ischemic disease.   MRI Brain 8/14: 1. No acute intracranial abnormality.  2. Mild chronic small vessel ischemic disease.   [name], MD 03/16/2014, 8:21 AM PGY-3, [facility] FPTS Intern pager: [phone], text pages welcome

## 2014-03-18 NOTE — Discharge Summary (Signed)
Seen and examined on the day of DC.  Agree with the documentation and management of Dr. McIntyre.   

## 2014-03-18 NOTE — Progress Notes (Signed)
Addendum to Physical therapy progress note from 8/14  03/15/14 1132  PT G-Codes **NOT FOR INPATIENT CLASS**  Functional Assessment Tool Used clinical judgement  Functional Limitation Mobility: Walking and moving around  Mobility: Walking and Moving Around Current Status [phone](G8978) CI  Mobility: Walking and Moving Around Goal Status [phone](G8979) CI  Mobility: Walking and Moving Around Discharge Status [phone](G8980) CI  Delaney MeigsMaija Tabor Kameela Leipold, PT [phone]

## 2014-03-20 ENCOUNTER — Telehealth: Payer: Self-pay | Admitting: Nurse Practitioner

## 2014-03-20 DIAGNOSIS — R002 Palpitations: Secondary | ICD-10-CM

## 2014-03-20 NOTE — Telephone Encounter (Signed)
Left message for patient to call the office to set up a 30 day monitor appointment

## 2014-03-21 NOTE — Telephone Encounter (Signed)
Patient returned my call and states she has an appointment [facility].  I reviewed Dr. Harvie BridgeNahser's advice for patient to wear 30-day monitor and patient states she was told during her hospitalization that her heart was "skipping beats."  Patient states she could not remember the name of the cardiologist she saw in the hospital and she was told she needed a soon appointment, so she [facility].  I advised patient that she could call back if [facility].  Patient verbalized understanding and agreement.  Patient called back quickly after previous conversation and states she would like to see Dr. Elease HashimotoNahser and get the monitor put on here rather than f[facility].  I scheduled patient for 30 day event monitor for tomorrow 8/21 and for f/u with Dr. Elease HashimotoNahser 9/9.  Patient verbalized agreement and gratitude.

## 2014-03-22 ENCOUNTER — Encounter (INDEPENDENT_AMBULATORY_CARE_PROVIDER_SITE_OTHER): Payer: Medicare Other

## 2014-03-22 ENCOUNTER — Encounter: Payer: Self-pay | Admitting: Radiology

## 2014-03-22 DIAGNOSIS — R002 Palpitations: Secondary | ICD-10-CM

## 2014-03-22 NOTE — Progress Notes (Signed)
Patient ID: Terry DamaDeborah Ball, female   DOB: 03/15/1951, 63 y.o.   MRN: [id number] E cardio 30 day monitor applied EOS 04-21-14

## 2014-04-10 ENCOUNTER — Encounter: Payer: Medicare Other | Admitting: Cardiovascular Disease

## 2014-04-25 ENCOUNTER — Telehealth: Payer: Self-pay | Admitting: Nurse Practitioner

## 2014-04-25 NOTE — Telephone Encounter (Signed)
Spoke with patient and reviewed eCardio monitor results of NSR, no atrial fib per Dr. Elease Hashimoto.  Patient verbalized understanding and gratitude and verified f/u appointment with Dr. Elease Hashimoto for 10/6.

## 2014-05-02 ENCOUNTER — Other Ambulatory Visit: Payer: Self-pay | Admitting: Orthopedic Surgery

## 2014-05-02 DIAGNOSIS — M545 Low back pain: Secondary | ICD-10-CM

## 2014-05-04 ENCOUNTER — Ambulatory Visit
Admission: RE | Admit: 2014-05-04 | Discharge: 2014-05-04 | Disposition: A | Discharge status: Home / Self Care | Payer: Medicare Other | Source: Ambulatory Visit | Attending: Orthopedic Surgery | Admitting: Orthopedic Surgery

## 2014-05-04 DIAGNOSIS — M545 Low back pain: Secondary | ICD-10-CM

## 2014-05-07 ENCOUNTER — Encounter: Payer: Medicare Other | Admitting: Cardiovascular Disease

## 2014-05-30 ENCOUNTER — Encounter: Payer: Medicare Other | Admitting: Cardiovascular Disease

## 2014-07-04 ENCOUNTER — Encounter: Payer: Medicare Other | Admitting: Cardiovascular Disease

## 2015-01-27 ENCOUNTER — Other Ambulatory Visit: Payer: Self-pay

## 2018-01-24 ENCOUNTER — Other Ambulatory Visit: Payer: Self-pay | Admitting: Orthopedic Surgery

## 2018-02-03 ENCOUNTER — Encounter (HOSPITAL_BASED_OUTPATIENT_CLINIC_OR_DEPARTMENT_OTHER): Payer: Self-pay | Admitting: *Deleted

## 2018-02-06 ENCOUNTER — Other Ambulatory Visit: Payer: Self-pay

## 2018-02-06 ENCOUNTER — Encounter (HOSPITAL_BASED_OUTPATIENT_CLINIC_OR_DEPARTMENT_OTHER): Payer: Self-pay | Admitting: *Deleted

## 2018-02-07 ENCOUNTER — Encounter (HOSPITAL_BASED_OUTPATIENT_CLINIC_OR_DEPARTMENT_OTHER)
Admission: RE | Admit: 2018-02-07 | Discharge: 2018-02-07 | Disposition: A | Discharge status: Home / Self Care | Payer: Medicare Other | Source: Ambulatory Visit | Attending: Orthopedic Surgery | Admitting: Orthopedic Surgery

## 2018-02-07 DIAGNOSIS — M21612 Bunion of left foot: Secondary | ICD-10-CM | POA: Diagnosis not present

## 2018-02-07 DIAGNOSIS — Z8673 Personal history of transient ischemic attack (TIA), and cerebral infarction without residual deficits: Secondary | ICD-10-CM | POA: Diagnosis not present

## 2018-02-07 DIAGNOSIS — Z87891 Personal history of nicotine dependence: Secondary | ICD-10-CM | POA: Diagnosis not present

## 2018-02-07 DIAGNOSIS — Z791 Long term (current) use of non-steroidal anti-inflammatories (NSAID): Secondary | ICD-10-CM | POA: Diagnosis not present

## 2018-02-07 DIAGNOSIS — F329 Major depressive disorder, single episode, unspecified: Secondary | ICD-10-CM | POA: Diagnosis not present

## 2018-02-07 DIAGNOSIS — K219 Gastro-esophageal reflux disease without esophagitis: Secondary | ICD-10-CM | POA: Diagnosis not present

## 2018-02-07 DIAGNOSIS — M79672 Pain in left foot: Secondary | ICD-10-CM | POA: Diagnosis present

## 2018-02-07 DIAGNOSIS — M7742 Metatarsalgia, left foot: Secondary | ICD-10-CM | POA: Diagnosis not present

## 2018-02-07 DIAGNOSIS — Z79899 Other long term (current) drug therapy: Secondary | ICD-10-CM | POA: Diagnosis not present

## 2018-02-09 ENCOUNTER — Ambulatory Visit (HOSPITAL_BASED_OUTPATIENT_CLINIC_OR_DEPARTMENT_OTHER): Payer: Medicare Other | Admitting: Anesthesiology

## 2018-02-09 ENCOUNTER — Encounter (HOSPITAL_BASED_OUTPATIENT_CLINIC_OR_DEPARTMENT_OTHER)
Admission: RE | Disposition: A | Discharge status: Home / Self Care | Payer: Self-pay | Source: Ambulatory Visit | Attending: Orthopedic Surgery

## 2018-02-09 ENCOUNTER — Encounter (HOSPITAL_BASED_OUTPATIENT_CLINIC_OR_DEPARTMENT_OTHER): Payer: Self-pay | Admitting: Anesthesiology

## 2018-02-09 ENCOUNTER — Other Ambulatory Visit: Payer: Self-pay

## 2018-02-09 ENCOUNTER — Ambulatory Visit (HOSPITAL_BASED_OUTPATIENT_CLINIC_OR_DEPARTMENT_OTHER)
Admission: RE | Admit: 2018-02-09 | Discharge: 2018-02-09 | Disposition: A | Discharge status: Home / Self Care | Payer: Medicare Other | Source: Ambulatory Visit | Attending: Orthopedic Surgery | Admitting: Orthopedic Surgery

## 2018-02-09 DIAGNOSIS — M21612 Bunion of left foot: Secondary | ICD-10-CM | POA: Insufficient documentation

## 2018-02-09 DIAGNOSIS — Z87891 Personal history of nicotine dependence: Secondary | ICD-10-CM | POA: Insufficient documentation

## 2018-02-09 DIAGNOSIS — M7742 Metatarsalgia, left foot: Secondary | ICD-10-CM | POA: Insufficient documentation

## 2018-02-09 DIAGNOSIS — Z8673 Personal history of transient ischemic attack (TIA), and cerebral infarction without residual deficits: Secondary | ICD-10-CM | POA: Diagnosis not present

## 2018-02-09 DIAGNOSIS — F329 Major depressive disorder, single episode, unspecified: Secondary | ICD-10-CM | POA: Insufficient documentation

## 2018-02-09 DIAGNOSIS — K219 Gastro-esophageal reflux disease without esophagitis: Secondary | ICD-10-CM | POA: Insufficient documentation

## 2018-02-09 DIAGNOSIS — Z791 Long term (current) use of non-steroidal anti-inflammatories (NSAID): Secondary | ICD-10-CM | POA: Insufficient documentation

## 2018-02-09 DIAGNOSIS — Z79899 Other long term (current) drug therapy: Secondary | ICD-10-CM | POA: Insufficient documentation

## 2018-02-09 HISTORY — PX: BUNIONECTOMY WITH WEIL OSTEOTOMY: SHX5604

## 2018-02-09 HISTORY — DX: Cardiac arrhythmia, unspecified: I49.9

## 2018-02-09 SURGERY — BUNIONECTOMY WITH WEIL OSTEOTOMY
Anesthesia: Regional | Site: Foot | Laterality: Left

## 2018-02-09 MED ORDER — DEXAMETHASONE SODIUM PHOSPHATE 10 MG/ML IJ SOLN
INTRAMUSCULAR | Status: AC
  Filled 2018-02-09: qty 1

## 2018-02-09 MED ORDER — ONDANSETRON HCL 4 MG/2ML IJ SOLN
INTRAMUSCULAR | Status: AC
  Filled 2018-02-09: qty 2

## 2018-02-09 MED ORDER — MIDAZOLAM HCL 2 MG/2ML IJ SOLN
INTRAMUSCULAR | Status: AC
  Filled 2018-02-09: qty 2

## 2018-02-09 MED ORDER — LACTATED RINGERS IV SOLN
INTRAVENOUS | Status: DC
  Administered 2018-02-09: 12:00:00 via INTRAVENOUS

## 2018-02-09 MED ORDER — ROPIVACAINE HCL 5 MG/ML IJ SOLN
INTRAMUSCULAR | Status: DC | PRN
  Administered 2018-02-09: 30 mL via PERINEURAL

## 2018-02-09 MED ORDER — 0.9 % SODIUM CHLORIDE (POUR BTL) OPTIME
TOPICAL | Status: DC | PRN
  Administered 2018-02-09: 1000 mL

## 2018-02-09 MED ORDER — SCOPOLAMINE 1 MG/3DAYS TD PT72
MEDICATED_PATCH | TRANSDERMAL | Status: AC
  Filled 2018-02-09: qty 1

## 2018-02-09 MED ORDER — CEFAZOLIN SODIUM-DEXTROSE 2-4 GM/100ML-% IV SOLN
INTRAVENOUS | Status: AC
  Filled 2018-02-09: qty 100

## 2018-02-09 MED ORDER — DEXAMETHASONE SODIUM PHOSPHATE 4 MG/ML IJ SOLN
INTRAMUSCULAR | Status: DC | PRN
  Administered 2018-02-09: 10 mg via INTRAVENOUS

## 2018-02-09 MED ORDER — OXYCODONE HCL 5 MG PO TABS: 5.0000 mg | ORAL_TABLET | Freq: Four times a day (QID) | ORAL | 0 refills | Status: DC | PRN

## 2018-02-09 MED ORDER — LIDOCAINE 2% (20 MG/ML) 5 ML SYRINGE
INTRAMUSCULAR | Status: DC | PRN
  Administered 2018-02-09: 100 mg via INTRAVENOUS

## 2018-02-09 MED ORDER — SCOPOLAMINE 1 MG/3DAYS TD PT72
1.0000 | MEDICATED_PATCH | Freq: Once | TRANSDERMAL | Status: DC | PRN
  Administered 2018-02-09: 1.5 mg via TRANSDERMAL

## 2018-02-09 MED ORDER — FENTANYL CITRATE (PF) 100 MCG/2ML IJ SOLN
INTRAMUSCULAR | Status: AC
Start: 2018-02-09 — End: ?
  Filled 2018-02-09: qty 2

## 2018-02-09 MED ORDER — SODIUM CHLORIDE 0.9 % IV SOLN: INTRAVENOUS | Status: DC

## 2018-02-09 MED ORDER — SCOPOLAMINE 1 MG/3DAYS TD PT72
1.0000 | MEDICATED_PATCH | TRANSDERMAL | Status: DC
Start: 2018-02-09 — End: 2018-02-09

## 2018-02-09 MED ORDER — FENTANYL CITRATE (PF) 100 MCG/2ML IJ SOLN
50.0000 ug | INTRAMUSCULAR | Status: DC | PRN
  Administered 2018-02-09: 50 ug via INTRAVENOUS

## 2018-02-09 MED ORDER — MIDAZOLAM HCL 2 MG/2ML IJ SOLN
1.0000 mg | INTRAMUSCULAR | Status: DC | PRN
  Administered 2018-02-09: 1 mg via INTRAVENOUS

## 2018-02-09 MED ORDER — CEFAZOLIN SODIUM-DEXTROSE 2-4 GM/100ML-% IV SOLN
2.0000 g | INTRAVENOUS | Status: AC
  Administered 2018-02-09: 2 g via INTRAVENOUS

## 2018-02-09 MED ORDER — ONDANSETRON HCL 4 MG/2ML IJ SOLN
INTRAMUSCULAR | Status: DC | PRN
  Administered 2018-02-09: 4 mg via INTRAVENOUS

## 2018-02-09 MED ORDER — CHLORHEXIDINE GLUCONATE 4 % EX LIQD: 60.0000 mL | Freq: Once | CUTANEOUS | Status: DC

## 2018-02-09 MED ORDER — PROPOFOL 500 MG/50ML IV EMUL
INTRAVENOUS | Status: AC
  Filled 2018-02-09: qty 50

## 2018-02-09 MED ORDER — PROPOFOL 10 MG/ML IV BOLUS
INTRAVENOUS | Status: DC | PRN
  Administered 2018-02-09: 150 mg via INTRAVENOUS

## 2018-02-09 MED ORDER — LIDOCAINE HCL (CARDIAC) PF 100 MG/5ML IV SOSY
PREFILLED_SYRINGE | INTRAVENOUS | Status: AC
  Filled 2018-02-09: qty 5

## 2018-02-09 SURGICAL SUPPLY — 73 items
BANDAGE ESMARK 6X9 LF (GAUZE/BANDAGES/DRESSINGS) IMPLANT
BIT DRILL 1.5X30 QC DISP (BIT) ×1 IMPLANT
BLADE AVERAGE 25X9 (BLADE) IMPLANT
BLADE LONG MED 25X9 (BLADE) ×2 IMPLANT
BLADE MICRO SAGITTAL (BLADE) IMPLANT
BLADE OSC/SAG .038X5.5 CUT EDG (BLADE) IMPLANT
BLADE SURG 15 STRL LF DISP TIS (BLADE) ×3 IMPLANT
BLADE SURG 15 STRL SS (BLADE) ×6
BNDG CMPR 9X4 STRL LF SNTH (GAUZE/BANDAGES/DRESSINGS)
BNDG CMPR 9X6 STRL LF SNTH (GAUZE/BANDAGES/DRESSINGS)
BNDG COHESIVE 4X5 TAN STRL (GAUZE/BANDAGES/DRESSINGS) ×2 IMPLANT
BNDG COHESIVE 6X5 TAN STRL LF (GAUZE/BANDAGES/DRESSINGS) IMPLANT
BNDG CONFORM 3 STRL LF (GAUZE/BANDAGES/DRESSINGS) ×2 IMPLANT
BNDG ESMARK 4X9 LF (GAUZE/BANDAGES/DRESSINGS) IMPLANT
BNDG ESMARK 6X9 LF (GAUZE/BANDAGES/DRESSINGS)
CHLORAPREP W/TINT 26ML (MISCELLANEOUS) ×2 IMPLANT
COVER BACK TABLE 60X90IN (DRAPES) ×2 IMPLANT
CUFF TOURNIQUET SINGLE 24IN (TOURNIQUET CUFF) IMPLANT
CUFF TOURNIQUET SINGLE 34IN LL (TOURNIQUET CUFF) ×2 IMPLANT
DRAPE EXTREMITY T 121X128X90 (DRAPE) ×2 IMPLANT
DRAPE OEC MINIVIEW 54X84 (DRAPES) ×2 IMPLANT
DRAPE U-SHAPE 47X51 STRL (DRAPES) ×2 IMPLANT
DRSG MEPITEL 4X7.2 (GAUZE/BANDAGES/DRESSINGS) ×2 IMPLANT
DRSG PAD ABDOMINAL 8X10 ST (GAUZE/BANDAGES/DRESSINGS) ×2 IMPLANT
ELECT REM PT RETURN 9FT ADLT (ELECTROSURGICAL) ×2
ELECTRODE REM PT RTRN 9FT ADLT (ELECTROSURGICAL) ×1 IMPLANT
GAUZE SPONGE 4X4 12PLY STRL (GAUZE/BANDAGES/DRESSINGS) ×2 IMPLANT
GLOVE BIO SURGEON STRL SZ8 (GLOVE) ×2 IMPLANT
GLOVE BIOGEL PI IND STRL 7.0 (GLOVE) ×2 IMPLANT
GLOVE BIOGEL PI IND STRL 8 (GLOVE) ×2 IMPLANT
GLOVE BIOGEL PI INDICATOR 7.0 (GLOVE) ×2
GLOVE BIOGEL PI INDICATOR 8 (GLOVE) ×1
GLOVE ECLIPSE 6.5 STRL STRAW (GLOVE) ×2 IMPLANT
GLOVE ECLIPSE 8.0 STRL XLNG CF (GLOVE) ×1 IMPLANT
GOWN STRL REUS W/ TWL LRG LVL3 (GOWN DISPOSABLE) ×1 IMPLANT
GOWN STRL REUS W/ TWL XL LVL3 (GOWN DISPOSABLE) ×1 IMPLANT
GOWN STRL REUS W/TWL LRG LVL3 (GOWN DISPOSABLE) ×2
GOWN STRL REUS W/TWL XL LVL3 (GOWN DISPOSABLE) ×2
K-WIRE .054X4 (WIRE) IMPLANT
NDL HYPO 25X1 1.5 SAFETY (NEEDLE) IMPLANT
NEEDLE HYPO 22GX1.5 SAFETY (NEEDLE) IMPLANT
NEEDLE HYPO 25X1 1.5 SAFETY (NEEDLE) IMPLANT
NS IRRIG 1000ML POUR BTL (IV SOLUTION) ×2 IMPLANT
PACK BASIN DAY SURGERY FS (CUSTOM PROCEDURE TRAY) ×2 IMPLANT
PAD CAST 4YDX4 CTTN HI CHSV (CAST SUPPLIES) ×1 IMPLANT
PADDING CAST COTTON 4X4 STRL (CAST SUPPLIES) ×2
PADDING CAST COTTON 6X4 STRL (CAST SUPPLIES) IMPLANT
PENCIL BUTTON HOLSTER BLD 10FT (ELECTRODE) ×2 IMPLANT
SANITIZER HAND PURELL 535ML FO (MISCELLANEOUS) ×1 IMPLANT
SCREW 2.0 CORTICAL FT 20MM (Screw) ×2 IMPLANT
SCREW 2.0MM CORTICAL FT 26MM (Screw) ×1 IMPLANT
SCREW CORTICAL 2.0X18MM (Screw) ×1 IMPLANT
SCREW HCS TWIST-OFF 2.0X12MM (Screw) ×4 IMPLANT
SHEET MEDIUM DRAPE 40X70 STRL (DRAPES) ×2 IMPLANT
SLEEVE SCD COMPRESS KNEE MED (MISCELLANEOUS) ×2 IMPLANT
SPLINT FAST PLASTER 5X30 (CAST SUPPLIES)
SPLINT PLASTER CAST FAST 5X30 (CAST SUPPLIES) IMPLANT
SPONGE LAP 18X18 RF (DISPOSABLE) ×2 IMPLANT
STOCKINETTE 6  STRL (DRAPES) ×1
STOCKINETTE 6 STRL (DRAPES) ×1 IMPLANT
SUCTION FRAZIER HANDLE 10FR (MISCELLANEOUS) ×1
SUCTION TUBE FRAZIER 10FR DISP (MISCELLANEOUS) ×1 IMPLANT
SUT ETHILON 3 0 PS 1 (SUTURE) ×2 IMPLANT
SUT MNCRL AB 3-0 PS2 18 (SUTURE) ×2 IMPLANT
SUT VIC AB 0 SH 27 (SUTURE) IMPLANT
SUT VIC AB 2-0 SH 27 (SUTURE)
SUT VIC AB 2-0 SH 27XBRD (SUTURE) IMPLANT
SUT VICRYL 0 UR6 27IN ABS (SUTURE) IMPLANT
SYR BULB 3OZ (MISCELLANEOUS) ×2 IMPLANT
SYR CONTROL 10ML LL (SYRINGE) IMPLANT
TOWEL GREEN STERILE FF (TOWEL DISPOSABLE) ×3 IMPLANT
TUBE CONNECTING 20X1/4 (TUBING) ×2 IMPLANT
UNDERPAD 30X30 (UNDERPADS AND DIAPERS) ×2 IMPLANT

## 2018-02-09 NOTE — Anesthesia Preprocedure Evaluation (Addendum)
Anesthesia Evaluation  Patient identified by MRN, date of birth, ID band Patient awake    Reviewed: Allergy & Precautions, NPO status , Patient's Chart, lab work & pertinent test results  History of Anesthesia Complications (+) PONV and history of anesthetic complications  Airway Mallampati: II  TM Distance: >3 FB Neck ROM: Full    Dental no notable dental hx. (+) Teeth Intact, Dental Advisory Given   Pulmonary neg pulmonary ROS, former smoker,    Pulmonary exam normal breath sounds clear to auscultation       Cardiovascular negative cardio ROS Normal cardiovascular exam Rhythm:Regular Rate:Normal     Neuro/Psych Depression TIA   GI/Hepatic hiatal hernia, GERD  ,  Endo/Other    Renal/GU      Musculoskeletal  (+) Fibromyalgia -  Abdominal   Peds  Hematology  (+) anemia ,   Anesthesia Other Findings   Reproductive/Obstetrics                             Anesthesia Physical Anesthesia Plan  ASA: III  Anesthesia Plan: General and Regional   Post-op Pain Management:  Regional for Post-op pain   Induction: Intravenous  PONV Risk Score and Plan: 4 or greater and Scopolamine patch - Pre-op, Treatment may vary due to age or medical condition, Ondansetron and Dexamethasone  Airway Management Planned: LMA  Additional Equipment:   Intra-op Plan:   Post-operative Plan:   Informed Consent: I have reviewed the patients History and Physical, chart, labs and discussed the procedure including the risks, benefits and alternatives for the proposed anesthesia with the patient or authorized representative who has indicated his/her understanding and acceptance.   Dental advisory given  Plan Discussed with: CRNA  Anesthesia Plan Comments:         Anesthesia Quick Evaluation

## 2018-02-09 NOTE — Anesthesia Procedure Notes (Signed)
Procedure Name: LMA Insertion Performed by: Naijah Lacek W, CRNA Pre-anesthesia Checklist: Patient identified, Emergency Drugs available, Suction available and Patient being monitored Patient Re-evaluated:Patient Re-evaluated prior to induction Oxygen Delivery Method: Circle system utilized Preoxygenation: Pre-oxygenation with 100% oxygen Induction Type: IV induction Ventilation: Mask ventilation without difficulty LMA: LMA inserted LMA Size: 4.0 Number of attempts: 1 Placement Confirmation: positive ETCO2 Tube secured with: Tape Dental Injury: Teeth and Oropharynx as per pre-operative assessment        

## 2018-02-09 NOTE — Op Note (Signed)
02/09/2018  2:14 PM  PATIENT:  Terry Ball  67 y.o. female  PRE-OPERATIVE DIAGNOSIS: 1.  Painful left foot bunion deformity 2.  Second and third metatarsalgia  POST-OPERATIVE DIAGNOSIS: Same  Procedure(s): 1.  Left modified McBride bunion correction 2.  Left first metatarsal scarf osteotomy 3.  Left hallux proximal phalanx Aiken osteotomy 4.  Left second metatarsal Weil osteotomy through separate incision 5.  Left third metatarsal Weil osteotomy through separate incision 6.  Left foot AP and lateral radiographs  SURGEON:  Toni Arthurs, MD  ASSISTANT: None  ANESTHESIA:   General, regional  EBL:  minimal   TOURNIQUET:   Total Tourniquet Time Documented: Thigh (Left) - 53 minutes Total: Thigh (Left) - 53 minutes  COMPLICATIONS:  None apparent  DISPOSITION:  Extubated, awake and stable to recovery.  INDICATION FOR PROCEDURE: The patient is a 67 year old female who has a long history of left forefoot pain due to a bunion deformity and metatarsalgia beneath the second and third metatarsal heads.  She has failed nonoperative treatment to date and presents today for surgical correction of these painful conditions.  The risks and benefits of the alternative treatment options have been discussed in detail.  The patient wishes to proceed with surgery and specifically understands risks of bleeding, infection, nerve damage, blood clots, need for additional surgery, amputation and death.    PROCEDURE IN DETAIL:  After pre operative consent was obtained, and the correct operative site was identified, the patient was brought to the operating room and placed supine on the OR table.  Anesthesia was administered.  Pre-operative antibiotics were administered.  A surgical timeout was taken.  The left lower extremity was prepped and draped in standard sterile fashion with a tourniquet around the thigh.  The extremity was elevated and the tourniquet was inflated to 250 mmHg.  A longitudinal  incision was made at the dorsum of the first webspace.  Dissection was carried down through the subcutaneous tissues.  The intermetatarsal ligament was divided under direct vision.  An arthrotomy was then made between the lateral sesamoid and the first metatarsal head.  Small perforations were made in the lateral joint capsule and the hallux could be positioned in 20 degrees of varus passively.  A longitudinal incision was then made over the medial eminence and extended along the first metatarsal shaft.  Dissection was carried down through the skin and subtenons tissues.  The medial joint capsule was incised and elevated dorsally and plantarly.  The hypertrophic medial eminence was resected with the oscillating saw.  A scarf osteotomy was then made in the first metatarsal shaft.  The head was translated laterally correcting the intermetatarsal and hallux valgus angles.  The osteotomy was fixed with 2 mm Biomet screws.  The overhanging bone was trimmed with a rondure.  The patient had residual hallux valgus interphalangeus.  The incision was extended distally exposing the proximal phalanx.  A closing wedge medial osteotomy was made with the oscillating saw all removing a small wedge of bone.  The osteotomy was closed and fixed with a 2 mm screw.  AP and lateral radiographs confirmed appropriate correction of the bunion deformity.  Attention was turned to the second webspace where a longitudinal incision was made.  Dissection was carried down through the skin and subtenons tissues.  The joint capsule for the second metatarsal was incised and elevated.  The extensor tendons were protected and a Weil osteotomy was made with the oscillating saw.  The head was black allowed to translate proximally  and was fixed with a 2 mm Biomet FRS screw.  The same procedure was then performed for the third metatarsal head and fixed in the same fashion.  Final AP and lateral radiographs confirmed appropriate shortening of the  second and third metatarsals.  The wounds were then irrigated copiously.  The medial joint capsule was repaired with imbricating sutures of Vicryl.  Subtenons tissues were approximated with Monocryl.  Skin incisions were closed with nylon.  Sterile dressings were applied followed by a bunion wrap.  The tourniquet was released after application of the dressings.  The patient was awakened from anesthesia and transported to the recovery room in stable condition.   FOLLOW UP PLAN: Weightbearing as tolerated on the heel in a Darco wedge shoe.  Follow-up in 2 weeks for suture removal and conversion to a toe spacer.   RADIOGRAPHS: AP and lateral radiographs of the left foot are obtained intraoperatively.  These show interval correction of the bunion deformities and shortening of the second and third metatarsals.  Hardware is appropriately positioned and of the appropriate lengths.  No other acute injuries.

## 2018-02-09 NOTE — Transfer of Care (Deleted)
Immediate Anesthesia Transfer of Care Note  Patient: Terry Ball  Procedure(s) Performed: Left SCARF osteotomy; modified, McBride; possible AKIN bunionectomy; left 2-3 metatarsal Weil osteotomy (Left )  Patient Location: PACU  Anesthesia Type:General  Level of Consciousness: awake and sedated  Airway & Oxygen Therapy: Patient Spontanous Breathing and Patient connected to face mask oxygen  Post-op Assessment: Report given to RN and Post -op Vital signs reviewed and stable  Post vital signs: Reviewed and stable  Last Vitals:  Vitals Value Taken Time  BP    Temp    Pulse    Resp    SpO2      Last Pain: There were no vitals filed for this visit.       Complications: No apparent anesthesia complications

## 2018-02-09 NOTE — H&P (Signed)
Terry Ball is an 67 y.o. female.   Chief Complaint: Left forefoot pain HPI: The patient is a 67 year old female who complains of a long history of left forefoot pain due to a bunion deformity.  She also has metatarsalgia beneath the second and third metatarsal heads.  She has failed nonoperative treatment to date including activity modification, oral anti-inflammatories and shoe wear modification.  She presents now for surgical treatment of this painful condition.  Past Medical History:  Diagnosis Date  . Anemia   . Anxiety   . Bursitis of other site    "neck"  . Chronic back pain   . Deafness in left ear   . Depression   . Dysrhythmia    "heart racing" takes atenolol  . Falls frequently    "lately" (03/15/2014)  . Family history of anesthesia complication    "mama gets confusion"  . Fibromyalgia   . GERD (gastroesophageal reflux disease)   . H/O hiatal hernia   . High cholesterol   . Hypotension   . Normal cardiac stress test 2013  . Osteoarthritis of both knees   . PONV (postoperative nausea and vomiting)   . SVT (supraventricular tachycardia) (HCC)    diagnosed by [facility] 2000  . TIA (transient ischemic attack)    "possibly; years ago" (03/15/2014)    Past Surgical History:  Procedure Laterality Date  . DILATION AND CURETTAGE OF UTERUS    . SHOULDER ARTHROSCOPY Bilateral    "spurs"  . TUBAL LIGATION      Family History  Problem Relation Age of Onset  . CAD Brother   . Heart attack Brother   . CAD Paternal Grandfather   . Heart attack Paternal Grandfather   . Brain cancer Father    Social History:  reports that she quit smoking about 5 years ago. Her smoking use included cigarettes. She has a 30.00 pack-year smoking history. She has never used smokeless tobacco. She reports that she drinks alcohol. She reports that she does not use drugs.  Allergies:  Allergies  Allergen Reactions  . Buprenorphine Hcl Itching  . Codeine     REACTION: rash,  itching  . Honey Bee Treatment [Bee Venom] Swelling  . Influenza Vaccines Hives  . Morphine And Related Itching    + nausea  . Sulfa Antibiotics Nausea Only  . Ropivacaine Hcl-Nacl Palpitations    Medications Prior to Admission  Medication Sig Dispense Refill  . atenolol (TENORMIN) 50 MG tablet Take 50 mg by mouth 2 (two) times daily.    . cholecalciferol (VITAMIN D) 1000 units tablet Take 1,000 Units by mouth daily.    . diclofenac (VOLTAREN) 75 MG EC tablet Take 75 mg by mouth 2 (two) times daily.    . Loratadine (CLARITIN) 10 MG CAPS Take 10 mg by mouth daily as needed.    . Multiple Vitamin (MULTIVITAMIN) tablet Take 1 tablet by mouth daily.    Marland Kitchen omeprazole (PRILOSEC) 40 MG capsule Take 40 mg by mouth 2 (two) times daily.    . pregabalin (LYRICA) 150 MG capsule Take 150 mg by mouth 2 (two) times daily.    . simvastatin (ZOCOR) 40 MG tablet Take 40 mg by mouth at bedtime.    . vitamin C (ASCORBIC ACID) 500 MG tablet Take 500 mg by mouth daily.    . DULoxetine (CYMBALTA) 30 MG capsule Take 1 capsule (30 mg total) by mouth daily. 30 capsule 0  . ipratropium (ATROVENT) 0.06 % nasal spray Place 2 sprays  into both nostrils 4 (four) times daily.    Marland Kitchen. lamoTRIgine (LAMICTAL) 200 MG tablet Take 200 mg by mouth at bedtime.    . naproxen sodium (ANAPROX) 220 MG tablet Take 220 mg by mouth 2 (two) times daily with a meal.    . risperiDONE (RISPERDAL) 0.5 MG tablet Take 0.5 mg by mouth 2 (two) times daily.    . valACYclovir (VALTREX) 500 MG tablet Take 500 mg by mouth daily.      No results found for this or any previous visit (from the past 48 hour(s)). No results found.  ROS no recent fever, chills, nausea, vomiting or changes in her appetite  Blood pressure (!) 118/47, pulse 69, temperature 97.9 F (36.6 C), temperature source Oral, resp. rate 20, height 5\' 2"  (1.575 m), weight 74.8 kg (165 lb), SpO2 100 %. Physical Exam  Well-nourished well-developed woman in no apparent distress.   Alert and oriented x4.  Mood and affect are normal.  Extraocular motions are intact.  Respirations are unlabored.  Gait is normal.  The left foot has a moderate bunion deformity.  Skin is healthy and intact.  No lymphadenopathy.  Pulses are palpable.  5 out of 5 strength in plantar flexion and dorsiflexion of the ankle and toes.  I cannot passively correct her bunion to a neutral position.  Assessment/Plan  Left foot painful bunion and metatarsalgia -to the operating room today for modified McBride bunionectomy and scarf osteotomy as well as second and third metatarsal Weil osteotomies.  She may also need an Aiken osteotomy.  The risks and benefits of the alternative treatment options have been discussed in detail.  The patient wishes to proceed with surgery and specifically understands risks of bleeding, infection, nerve damage, blood clots, need for additional surgery, amputation and death.   Toni ArthursHEWITT, Jashun Puertas, MD 02/09/2018, 12:17 PM

## 2018-02-09 NOTE — Discharge Instructions (Addendum)
Regional Anesthesia Blocks  1. Numbness or the inability to move the "blocked" extremity may last from 3-48 hours after placement. The length of time depends on the medication injected and your individual response to the medication. If the numbness is not going away after 48 hours, call your surgeon.  2. The extremity that is blocked will need to be protected until the numbness is gone and the  Strength has returned. Because you cannot feel it, you will need to take extra care to avoid injury. Because it may be weak, you may have difficulty moving it or using it. You may not know what position it is in without looking at it while the block is in effect.  3. For blocks in the legs and feet, returning to weight bearing and walking needs to be done carefully. You will need to wait until the numbness is entirely gone and the strength has returned. You should be able to move your leg and foot normally before you try and bear weight or walk. You will need someone to be with you when you first try to ensure you do not fall and possibly risk injury.  4. Bruising and tenderness at the needle site are common side effects and will resolve in a few days.  5. Persistent numbness or new problems with movement should be communicated to the surgeon or the [facility] [place] [phone])/ [facility]  [phone]).    Post Anesthesia Home Care Instructions  Activity: Get plenty of rest for the remainder of the day. A responsible individual must stay with you for 24 hours following the procedure.  For the next 24 hours, DO NOT: -Drive a car -Advertising copywriterperate machinery -Drink alcoholic beverages -Take any medication unless instructed by your physician -Make any legal decisions or sign important papers.  Meals: Start with liquid foods such as gelatin or soup. Progress to regular foods as tolerated. Avoid greasy, spicy, heavy foods. If nausea and/or vomiting occur, drink only clear liquids until the  nausea and/or vomiting subsides. Call your physician if vomiting continues.  Special Instructions/Symptoms: Your throat may feel dry or sore from the anesthesia or the breathing tube placed in your throat during surgery. If this causes discomfort, gargle with warm salt water. The discomfort should disappear within 24 hours.  If you had a scopolamine patch placed behind your ear for the management of post- operative nausea and/or vomiting:  1. The medication in the patch is effective for 72 hours, after which it should be removed.  Wrap patch in a tissue and discard in the trash. Wash hands thoroughly with soap and water. 2. You may remove the patch earlier than 72 hours if you experience unpleasant side effects which may include dry mouth, dizziness or visual disturbances. 3. Avoid touching the patch. Wash your hands with soap and water after contact with the patch.   Toni ArthursJohn Hewitt, MD [facility]  Please read the following information regarding your care after surgery.  Medications  You only need a prescription for the narcotic pain medicine (ex. oxycodone, Percocet, Norco).  All of the other medicines listed below are available over the counter. X Aleve 2 pills twice a day for the first 3 days after surgery. X acetominophen (Tylenol) 650 mg every 4-6 hours as you need for minor to moderate pain X oxycodone as prescribed for severe pain  Narcotic pain medicine (ex. oxycodone, Percocet, Vicodin) will cause constipation.  To prevent this problem, take the following medicines while you are taking  any pain medicine. X docusate sodium (Colace) 100 mg twice a day X senna (Senokot) 2 tablets twice a day  ? To help prevent blood clots, take a baby aspirin (81 mg) twice a day for two weeks after surgery.  You should also get up every hour while you are awake to move around.    Weight Bearing ? Bear weight when you are able on your operated leg or foot. X Bear weight only on your heel  in the post-op shoe. ? Do not bear any weight on the operated leg or foot.  Cast / Splint / Dressing X Keep your splint, cast or dressing clean and dry.  Dont put anything (coat hanger, pencil, etc) down inside of it.  If it gets damp, use a hair dryer on the cool setting to dry it.  If it gets soaked, call the office to schedule an appointment for a cast change. ? Remove your dressing 3 days after surgery and cover the incisions with dry dressings.    After your dressing, cast or splint is removed; you may shower, but do not soak or scrub the wound.  Allow the water to run over it, and then gently pat it dry.  Swelling It is normal for you to have swelling where you had surgery.  To reduce swelling and pain, keep your toes above your nose for at least 3 days after surgery.  It may be necessary to keep your foot or leg elevated for several weeks.  If it hurts, it should be elevated.  Follow Up Call my office at [phone] when you are discharged from the hospital or surgery center to schedule an appointment to be seen two weeks after surgery.  Call my office at [phone] if you develop a fever >101.5 F, nausea, vomiting, bleeding from the surgical site or severe pain.

## 2018-02-09 NOTE — Anesthesia Procedure Notes (Signed)
Anesthesia Regional Block: Popliteal block   Pre-Anesthetic Checklist: ,, timeout performed, Correct Patient, Correct Site, Correct Laterality, Correct Procedure, Correct Position, site marked, Risks and benefits discussed, pre-op evaluation,  At surgeon's request and post-op pain management  Laterality: Left  Prep: Maximum Sterile Barrier Precautions used, chloraprep       Needles:  Injection technique: Single-shot  Needle Type: Echogenic Needle     Needle Length: 9cm  Needle Gauge: 21     Additional Needles:   Procedures:,,,, ultrasound used (permanent image in chart),,,,  Narrative:  Start time: 02/09/2018 11:55 AM End time: 02/09/2018 12:08 PM Injection made incrementally with aspirations every 5 mL. Anesthesiologist: Trevor IhaHouser, Ethel Meisenheimer A, MD

## 2018-02-09 NOTE — Transfer of Care (Signed)
Immediate Anesthesia Transfer of Care Note  Patient: Terry Ball  Procedure(s) Performed: Left SCARF osteotomy; modified, McBride; possible AKIN bunionectomy; left 2-3 metatarsal Weil osteotomy (Left )  Patient Location: PACU  Anesthesia Type:General and Regional  Level of Consciousness: awake and sedated  Airway & Oxygen Therapy: Patient Spontanous Breathing and Patient connected to face mask oxygen  Post-op Assessment: Report given to RN and Post -op Vital signs reviewed and stable  Post vital signs: Reviewed and stable  Last Vitals:  Vitals Value Taken Time  BP 112/55 02/09/2018  1:57 PM  Temp    Pulse 73 02/09/2018  1:58 PM  Resp 12 02/09/2018  1:58 PM  SpO2 100 % 02/09/2018  1:58 PM  Vitals shown include unvalidated device data.  Last Pain:  Vitals:   02/09/18 1112  TempSrc: Oral  PainSc: 0-No pain         Complications: No apparent anesthesia complications

## 2018-02-09 NOTE — Progress Notes (Signed)
Assisted Dr. Houser with left, ultrasound guided, popliteal block. Side rails up, monitors on throughout procedure. See vital signs in flow sheet. Tolerated Procedure well. 

## 2018-02-09 NOTE — Anesthesia Postprocedure Evaluation (Signed)
Anesthesia Post Note  Patient: Stefani DamaDeborah Prentiss  Procedure(s) Performed: Left SCARF osteotomy; modified, McBride;  AKIN bunionectomy; left 2-3 metatarsal Weil osteotomy (Left Foot)     Patient location during evaluation: PACU Anesthesia Type: Regional and General Level of consciousness: awake and alert Pain management: pain level controlled Vital Signs Assessment: post-procedure vital signs reviewed and stable Respiratory status: spontaneous breathing, nonlabored ventilation, respiratory function stable and patient connected to nasal cannula oxygen Cardiovascular status: blood pressure returned to baseline and stable Postop Assessment: no apparent nausea or vomiting Anesthetic complications: no    Last Vitals:  Vitals:   02/09/18 1415 02/09/18 1425  BP: 121/62   Pulse: 77 78  Resp: 14 14  Temp:    SpO2: 100% 96%    Last Pain:  Vitals:   02/09/18 1415  TempSrc:   PainSc: 0-No pain                 Trevor IhaStephen A Renso Swett

## 2018-02-13 ENCOUNTER — Encounter (HOSPITAL_BASED_OUTPATIENT_CLINIC_OR_DEPARTMENT_OTHER): Payer: Self-pay | Admitting: Orthopedic Surgery

## 2019-10-22 ENCOUNTER — Encounter: Payer: Self-pay | Admitting: Plastic Surgery

## 2019-11-06 ENCOUNTER — Ambulatory Visit (INDEPENDENT_AMBULATORY_CARE_PROVIDER_SITE_OTHER): Payer: Self-pay | Admitting: Plastic Surgery

## 2019-11-06 ENCOUNTER — Encounter: Payer: Self-pay | Admitting: Plastic Surgery

## 2019-11-06 ENCOUNTER — Other Ambulatory Visit: Payer: Self-pay

## 2019-11-06 DIAGNOSIS — T8543XA Leakage of breast prosthesis and implant, initial encounter: Secondary | ICD-10-CM | POA: Diagnosis not present

## 2019-11-06 NOTE — Progress Notes (Signed)
Patient ID: Terry Ball, female    DOB: 08-08-1950, 69 y.o.   MRN: [id number]   Chief Complaint  Patient presents with  . Advice Only    ruptured implants    The patient is a 69 year old white female here for evaluation of her ruptured implants.  The patient had a cone breast implants placed in 1979 by a physician in [facility].  She is not sure of the location, size or brand.  She went from a B cup size to a C cup size.  She is currently a 40 C/D.  She does not have a history of breast cancer.  She is 5 feet 2 inches tall and weighs 167 pounds she had a mammogram in March which showed extracapsular rupture she is very uncomfortable and states that she feels a lot of pain on both sides.  She is not able to lay on her stomach.  She has grade 3 ptosis of both breasts.  Both breasts are firm and I can confirm by physical exam most likely a rupture on both sides due to the firmness and distortion of the breast.  Like with capsule contractures on both sides.   Review of Systems  Constitutional: Negative.   HENT: Negative.   Eyes: Negative.   Respiratory: Negative.   Cardiovascular: Negative.   Gastrointestinal: Negative.   Endocrine: Negative.   Genitourinary: Negative.   Musculoskeletal: Negative.   Hematological: Negative.     Past Medical History:  Diagnosis Date  . Anemia   . Anxiety   . Bursitis of other site    "neck"  . Chronic back pain   . Deafness in left ear   . Depression   . Dysrhythmia    "heart racing" takes atenolol  . Falls frequently    "lately" (03/15/2014)  . Family history of anesthesia complication    "mama gets confusion"  . Fibromyalgia   . GERD (gastroesophageal reflux disease)   . H/O hiatal hernia   . High cholesterol   . Hypotension   . Normal cardiac stress test 2013  . Osteoarthritis of both knees   . PONV (postoperative nausea and vomiting)   . SVT (supraventricular tachycardia) ([place])    diagnosed by [place]  . TIA  (transient ischemic attack)    "possibly; years ago" (03/15/2014)    Past Surgical History:  Procedure Laterality Date  . BUNIONECTOMY WITH WEIL OSTEOTOMY Left 02/09/2018   Procedure: Left SCARF osteotomy; modified, McBride;  AKIN bunionectomy; left 2-3 metatarsal Weil osteotomy;  Surgeon: Wylene Simmer, MD;  Location: [place];  Service: Orthopedics;  Laterality: Left;  . DILATION AND CURETTAGE OF UTERUS    . SHOULDER ARTHROSCOPY Bilateral    "spurs"  . TUBAL LIGATION        Current Outpatient Medications:  .  amitriptyline (ELAVIL) 50 MG tablet, , Disp: , Rfl:  .  ascorbic acid (VITAMIN C) 500 MG tablet, Take by mouth., Disp: , Rfl:  .  atenolol (TENORMIN) 50 MG tablet, Take 50 mg by mouth 2 (two) times daily., Disp: , Rfl:  .  cholecalciferol (VITAMIN D) 1000 units tablet, Take 1,000 Units by mouth daily., Disp: , Rfl:  .  diclofenac (VOLTAREN) 75 MG EC tablet, Voltaren 75 mg tablet,delayed release  Take 1 tablet by oral route as needed., Disp: , Rfl:  .  diclofenac Sodium (VOLTAREN) 1 % GEL, Apply topically 4 (four) times daily., Disp: , Rfl:  .  estradiol (ESTRACE)  0.5 MG tablet, , Disp: , Rfl:  .  ipratropium (ATROVENT) 0.06 % nasal spray, Place 2 sprays into both nostrils 4 (four) times daily., Disp: , Rfl:  .  Loratadine (CLARITIN) 10 MG CAPS, Take 10 mg by mouth daily as needed., Disp: , Rfl:  .  MEDROXYPROGESTERONE ACETATE PO, , Disp: , Rfl:  .  Multiple Vitamin (MULTIVITAMIN) tablet, Take 1 tablet by mouth daily., Disp: , Rfl:  .  omeprazole (PRILOSEC) 40 MG capsule, Take 40 mg by mouth 2 (two) times daily., Disp: , Rfl:  .  simvastatin (ZOCOR) 40 MG tablet, Take 40 mg by mouth at bedtime., Disp: , Rfl:  .  triamcinolone cream (KENALOG) 0.1 %, Apply topically., Disp: , Rfl:  .  valACYclovir (VALTREX) 500 MG tablet, Take 500 mg by mouth daily., Disp: , Rfl:  .  vitamin C (ASCORBIC ACID) 500 MG tablet, Take 500 mg by mouth daily., Disp: , Rfl:  .  DULoxetine  (CYMBALTA) 30 MG capsule, Take 1 capsule (30 mg total) by mouth daily., Disp: 30 capsule, Rfl: 0 .  lamoTRIgine (LAMICTAL) 200 MG tablet, Take 200 mg by mouth at bedtime., Disp: , Rfl:  .  oxyCODONE (ROXICODONE) 5 MG immediate release tablet, Take 1 tablet (5 mg total) by mouth every 6 (six) hours as needed for severe pain., Disp: 5 tablet, Rfl: 0 .  pregabalin (LYRICA) 150 MG capsule, Take 150 mg by mouth 2 (two) times daily., Disp: , Rfl:  .  risperiDONE (RISPERDAL) 0.5 MG tablet, Take 0.5 mg by mouth 2 (two) times daily., Disp: , Rfl:    Objective:   Vitals:   11/06/19 1459  BP: 99/65  Pulse: 81  Temp: (!) 96.8 F (36 C)  SpO2: 97%    Physical Exam Vitals and nursing note reviewed.  Constitutional:      Appearance: Normal appearance.  HENT:     Head: Normocephalic and atraumatic.  Cardiovascular:     Rate and Rhythm: Normal rate.  Pulmonary:     Effort: Pulmonary effort is normal. No respiratory distress.  Abdominal:     General: Abdomen is flat. There is no distension.  Skin:    General: Skin is warm.     Capillary Refill: Capillary refill takes less than 2 seconds.  Neurological:     General: No focal deficit present.     Mental Status: She is alert and oriented to person, place, and time.  Psychiatric:        Mood and Affect: Mood normal.        Thought Content: Thought content normal.     Assessment & Plan:  Ruptured silicone breast implant, initial encounter  Had a very nice discussion regarding her options.  She can have the implants removed and wait for the skin to tighten up a little bit.  She can have the implants removed with a mastopexy.  She can have the implant removed with a mastopexy and replacement of the same size or smaller implants.  We would do a capsulectomy if the implants are not replaced.  Considering her age I would recommend removal of the implants with mastopexy and no replacement of the implants.  She could then augment her bra with implants  in the bra from [facility] [employer] [facility].  This would be the safest option.  We will get her a quote.  She definitely needs the ruptured implants removed.  She is going to think things over and let us know what she would like to  do. Pictures were obtained of the patient and placed in the chart with the patient's or guardian's permission.  Peggye Form, DO   The 21st Century Cures Act was signed into law in 2016 which includes the topic of electronic health records.  This provides immediate access to information in MyChart.  This includes consultation notes, operative notes, office notes, lab results and pathology reports.  If you have any questions about what you read please let us know at your next visit or call us at the office.  We are right here with you.

## 2019-11-08 ENCOUNTER — Telehealth: Payer: Self-pay

## 2019-11-08 ENCOUNTER — Other Ambulatory Visit: Payer: Self-pay

## 2019-11-08 NOTE — Telephone Encounter (Signed)
Surgical clearance faxed to: [facility]- ph# [phone]/fax# [phone] & faxed to: [facility]- ph# [phone]/fax#[phone] Copy sent to Shayna/scheduler, Dr. Ulice Bold & [place], PA Copy also scanned to chart

## 2019-11-08 NOTE — Telephone Encounter (Signed)
-----   Message from Peggye Form, DO sent at 11/07/2019 10:40 AM EDT ----- Regarding: RE: surgery for SCA Leslie Jester - Can you help get cardiac or PCP clearance? ----- Message ----- From: Frederik Schmidt Sent: 11/07/2019   8:57 AM EDT To: Dwan Bolt Dillingham, DO Subject: surgery for SCA                                Ms. Utke meets the guidelines listed by Medicare for the removal of the ruptured implants. My concern is making sure that she can be done at [facility] due to her SVT and TIA. I think it would be ideal to get either PCP or Cardiac clearance before we schedule a surgery date so that we have that confirmation. As long as we can clear those items, we should be good to go.

## 2019-11-09 ENCOUNTER — Telehealth: Payer: Self-pay

## 2019-11-09 NOTE — Telephone Encounter (Signed)
Surgical clearance report received via fax from Dr. Brynda Rim [facility])- with [facility] Per report:  Dr. Brynda Rim last saw the pt on 09/27/19  He addressed the pharmacy clearance for surgery: he recommends pt takes meds with a sip of water morning of surgery &  He notes no other meds need to be held for surgery I have forwarded this to Dr. Dillingham/Matt, PA/Johanna, PA & Osvaldo Human (scheduler) I have also scanned copy into chart  We are still waiting for clearance from [facility]- will forward those results when available

## 2019-11-14 ENCOUNTER — Telehealth: Payer: Self-pay

## 2019-11-14 NOTE — Telephone Encounter (Signed)
Surgical clearance form received from Dr. Donnal Moat [facility] This document will be forwarded to Dr. Roselle Locus, PA & will be scanned into chart & to [facility] ( surgery coordinator)

## 2019-11-14 NOTE — Telephone Encounter (Signed)
Surgical clearance form received from Dr. Moser/[facility] This document will be forwarded to Dr. Dillingham, Matt, PA & will be scanned into chart & to Shayna ( surgery coordinator) 

## 2019-12-13 NOTE — Progress Notes (Signed)
Patient ID: Terry Ball, female    DOB: 02-Jan-1951, 69 y.o.   MRN: [id number]  Chief Complaint  Patient presents with  . Pre-op Exam    for removal of (B) ruptured implants w/capsulectomy,mastopexy      ICD-10-CM   1. Ruptured silicone breast implant, initial encounter  T85.43XA    History of Present Illness: Terry Ball is a 69 y.o.  female  with a history of silicone implant placement in 1979, she had a mammogram in March of this year that noted extracapsular rupture.  She presents for preoperative evaluation for upcoming procedure, removal of bilateral ruptured implants, capsulectomy, bilateral mastopexy, scheduled for 12/27/2019 with Dr. Marla Roe.  The patient has not had problems with anesthesia.  No history or family history of DVT/PE.  No family or personal history of bleeding or clotting disorders.  Patient is not taking any blood thinners.  She is currently on medroxyprogesterone for hot flashes.  Surgical clearance for patient previously sent to Dr. Daryel Gerald, patient's PCP at [facility].  Not necessary to hold any medications. Patient also had clearance from [facility] faxed to [place].  Patient has a past medical history of anemia, anxiety, high cholesterol, fibromyalgia, GERD, SVT.  She has been feeling well lately without any episodes of SVT or palpitations.  She reports that she gets palpitations approximately twice a year.  Last episode was about a year ago.  She has not had any recent shortness of breath, fevers, chills, nausea, vomiting, chest pain, palpitations.  She is planning to spend the weekend with her grandkids this weekend and they are going to go to a rodeo.   Past Medical History: Allergies: Allergies  Allergen Reactions  . Bee Venom Swelling  . Buprenorphine Hcl Itching  . Codeine     REACTION: rash, itching  . Influenza Vaccines Hives  . Morphine And Related Itching    + nausea  . Sulfa Antibiotics Nausea Only  . Ropivacaine Hcl-Nacl  Palpitations  . Sodium Hyaluronate ([place]) Palpitations    Current Medications:  Current Outpatient Medications:  .  amitriptyline (ELAVIL) 50 MG tablet, Take 50 mg by mouth at bedtime. , Disp: , Rfl:  .  ascorbic acid (VITAMIN C) 500 MG tablet, Take by mouth., Disp: , Rfl:  .  atenolol (TENORMIN) 50 MG tablet, Take 50 mg by mouth 2 (two) times daily., Disp: , Rfl:  .  cholecalciferol (VITAMIN D) 1000 units tablet, Take 1,000 Units by mouth daily., Disp: , Rfl:  .  diclofenac (VOLTAREN) 75 MG EC tablet, Voltaren 75 mg tablet,delayed release  Take 1 tablet by oral route as needed., Disp: , Rfl:  .  diclofenac Sodium (VOLTAREN) 1 % GEL, Apply topically 4 (four) times daily., Disp: , Rfl:  .  estradiol (ESTRACE) 0.5 MG tablet, , Disp: , Rfl:  .  Loratadine (CLARITIN) 10 MG CAPS, Take 10 mg by mouth daily as needed., Disp: , Rfl:  .  MEDROXYPROGESTERONE ACETATE PO, , Disp: , Rfl:  .  Multiple Vitamin (MULTIVITAMIN) tablet, Take 1 tablet by mouth daily., Disp: , Rfl:  .  omeprazole (PRILOSEC) 40 MG capsule, Take 40 mg by mouth 2 (two) times daily., Disp: , Rfl:  .  simvastatin (ZOCOR) 40 MG tablet, Take 40 mg by mouth at bedtime., Disp: , Rfl:  .  triamcinolone cream (KENALOG) 0.1 %, Apply topically., Disp: , Rfl:  .  valACYclovir (VALTREX) 500 MG tablet, Take 500 mg by mouth daily., Disp: , Rfl:  .  vitamin C (ASCORBIC ACID) 500 MG tablet, Take 500 mg by mouth daily., Disp: , Rfl:   Past Medical Problems: Past Medical History:  Diagnosis Date  . Anemia   . Anxiety   . Bursitis of other site    "neck"  . Chronic back pain   . Deafness in left ear   . Depression   . Dysrhythmia    "heart racing" takes atenolol  . Falls frequently    "lately" (03/15/2014)  . Family history of anesthesia complication    "mama gets confusion"  . Fibromyalgia   . GERD (gastroesophageal reflux disease)   . H/O hiatal hernia   . High cholesterol   . Hypotension   . Normal cardiac stress test 2013  .  Osteoarthritis of both knees   . PONV (postoperative nausea and vomiting)   . SVT (supraventricular tachycardia) ([facility])    diagnosed by [facility] 2000  . TIA (transient ischemic attack)    "possibly; years ago" (03/15/2014)    Past Surgical History: Past Surgical History:  Procedure Laterality Date  . BUNIONECTOMY WITH WEIL OSTEOTOMY Left 02/09/2018   Procedure: Left SCARF osteotomy; modified, McBride;  AKIN bunionectomy; left 2-3 metatarsal Weil osteotomy;  Surgeon: Toni Arthurs, MD;  Location: [facility];  Service: Orthopedics;  Laterality: Left;  . DILATION AND CURETTAGE OF UTERUS    . SHOULDER ARTHROSCOPY Bilateral    "spurs"  . TUBAL LIGATION      Social History: Social History   Socioeconomic History  . Marital status: Widowed    Spouse name: Not on file  . Number of children: Not on file  . Years of education: Not on file  . Highest education level: Not on file  Occupational History  . Not on file  Tobacco Use  . Smoking status: Former Smoker    Packs/day: 1.00    Years: 30.00    Pack years: 30.00    Types: Cigarettes    Quit date: 03/14/2012    Years since quitting: 7.7  . Smokeless tobacco: Never Used  Substance and Sexual Activity  . Alcohol use: Yes    Comment: 03/15/2014 "once/month I might have 1 drink"  . Drug use: No  . Sexual activity: Never  Other Topics Concern  . Not on file  Social History Narrative  . Not on file   Social Determinants of Health   Financial Resource Strain:   . Difficulty of Paying Living Expenses:   Food Insecurity:   . Worried About Programme researcher, broadcasting/film/video in the Last Year:   . Barista in the Last Year:   Transportation Needs:   . Freight forwarder (Medical):   Marland Kitchen Lack of Transportation (Non-Medical):   Physical Activity:   . Days of Exercise per Week:   . Minutes of Exercise per Session:   Stress:   . Feeling of Stress :   Social Connections:   . Frequency of Communication with Friends  and Family:   . Frequency of Social Gatherings with Friends and Family:   . Attends Religious Services:   . Active Member of Clubs or Organizations:   . Attends Banker Meetings:   Marland Kitchen Marital Status:   Intimate Partner Violence:   . Fear of Current or Ex-Partner:   . Emotionally Abused:   Marland Kitchen Physically Abused:   . Sexually Abused:     Family History: Family History  Problem Relation Age of Onset  . CAD Brother   .  Heart attack Brother   . CAD Paternal Grandfather   . Heart attack Paternal Grandfather   . Brain cancer Father     Review of Systems: Review of Systems  Constitutional: Negative.   Respiratory: Negative.   Cardiovascular: Negative.   Gastrointestinal: Negative.   Musculoskeletal: Negative.     Physical Exam: Vital Signs BP 127/66 (BP Location: Left Arm, Patient Position: Sitting, Cuff Size: Normal)   Pulse 86   Temp (!) 97.3 F (36.3 C) (Temporal)   Ht 5\' 2"  (1.575 m)   Wt 165 lb (74.8 kg)   SpO2 98%   BMI 30.18 kg/m  Physical Exam Exam conducted with a chaperone present.  Constitutional:      General: She is not in acute distress.    Appearance: Normal appearance. She is not ill-appearing.  HENT:     Head: Normocephalic and atraumatic.  Eyes:     Pupils: Pupils are equal, round Neck:     Musculoskeletal: Normal range of motion.  Cardiovascular:     Rate and Rhythm: Normal rate and regular rhythm.     Pulses: Normal pulses.     Heart sounds: Normal heart sounds. No murmur.  Pulmonary:     Effort: Pulmonary effort is normal. No respiratory distress.     Breath sounds: Normal breath sounds. No wheezing.  Abdominal:     General: Abdomen is flat. There is no distension.     Palpations: Abdomen is soft.     Tenderness: There is no abdominal tenderness.  Musculoskeletal: Normal range of motion.  Skin:    General: Skin is warm and dry.     Findings: No erythema or rash.  Neurological:     General: No focal deficit present.      Mental Status: She is alert and oriented to person, place, and time. Mental status is at baseline.     Motor: No weakness.  Psychiatric:        Mood and Affect: Mood normal.        Behavior: Behavior normal.   Assessment/Plan: The patient is scheduled for removal of bilateral ruptured implants, capsulectomy, bilateral mastopexy with Dr. on 12/27/2019.  Risks, benefits, and alternatives of procedure discussed, questions answered and consent obtained.    Patient provided with general consent form covering surgical risks associated with surgical intervention. Patient was allocated adequate time prior to appointment to read through, initial and sign that they agree that they are aware of the risks associated. We also covered the risks in person during this pre-op appointment. I answered all of the patient's questions to their content and informed them to call with any further questions or concerns prior to surgery and I would be happy to discuss with them further.  The risk that can be encountered with breast augmentation or maastopexy were discussed and include the following but not limited to these:  Breast asymmetry, fluid accumulation, firmness of the breast, inability to breast feed, loss of nipple or areola, skin loss, decrease or no nipple sensation, fat necrosis of the breast tissue, bleeding, infection, healing delay.  Deep vein thrombosis, cardiac and pulmonary complications are risks to any procedure. The implant can have a faulty position or one different from what you had desired.  The implant can have rippling, wrinkling, leakage or rupture. There are risks of anesthesia, changes to skin sensation and injury to nerves or blood vessels.  The muscle can be temporarily or permanently injured.  You may have an allergic reaction to tape,  suture, glue, blood products which can result in skin discoloration, swelling, pain, skin lesions, poor healing.  Any of these can lead to the need for  revisonal surgery or stage procedures.  A reduction has potential to interfere with diagnostic procedures.  Nipple or breast piercing can increase risks of infection.    This procedure is best done when the breast is fully developed.  Changes in the breast will continue to occur over time.  Pregnancy can alter the outcomes of previous breast reduction surgery, weight gain and weigh loss can also effect the long term appearance. Implants are not guaranteed to last a lifetime.  Future surgery may be required.  Regular examinations of the breast are required to evaluate the condition of your breasts and implants.    Mammogram: March 2021, noted to have ruptured breast implant Smoking status: quit 6 years ago Caprini score: high, surgery > 45 min, BMI > 25, On HRT for hot flashes, + varicose veins, age. Recommend mechanical ppx, early ambulation, possible chemoppx.  Prescription sent to pharmacy. Patient has tolerated percocet in the past, unknown history of use of norco, allergic to codeine.    Electronically signed by: Kermit Balo Gayland Nicol, PA-C 12/14/2019 10:47 AM

## 2019-12-14 ENCOUNTER — Ambulatory Visit (INDEPENDENT_AMBULATORY_CARE_PROVIDER_SITE_OTHER): Payer: Self-pay | Admitting: Surgical

## 2019-12-14 ENCOUNTER — Other Ambulatory Visit: Payer: Self-pay

## 2019-12-14 ENCOUNTER — Encounter: Payer: Self-pay | Admitting: Surgical

## 2019-12-14 VITALS — BP 127/66 | HR 86 | Temp 97.3°F | Ht 62.0 in | Wt 165.0 lb

## 2019-12-14 DIAGNOSIS — T8543XA Leakage of breast prosthesis and implant, initial encounter: Secondary | ICD-10-CM

## 2019-12-14 DIAGNOSIS — Z719 Counseling, unspecified: Secondary | ICD-10-CM

## 2019-12-14 MED ORDER — CEPHALEXIN 500 MG PO CAPS: 500.0000 mg | ORAL_CAPSULE | Freq: Four times a day (QID) | ORAL | 0 refills | Status: AC

## 2019-12-14 MED ORDER — OXYCODONE-ACETAMINOPHEN 5-325 MG PO TABS
1.0000 | ORAL_TABLET | Freq: Four times a day (QID) | ORAL | 0 refills | Status: AC | PRN
Start: 2019-12-14 — End: 2019-12-19

## 2019-12-14 MED ORDER — ONDANSETRON HCL 4 MG PO TABS: 4.0000 mg | ORAL_TABLET | Freq: Three times a day (TID) | ORAL | 0 refills | Status: DC | PRN

## 2019-12-27 ENCOUNTER — Encounter: Payer: Self-pay | Admitting: Plastic Surgery

## 2019-12-27 DIAGNOSIS — T8543XA Leakage of breast prosthesis and implant, initial encounter: Secondary | ICD-10-CM | POA: Diagnosis not present

## 2019-12-28 ENCOUNTER — Telehealth: Payer: Self-pay | Admitting: Plastic Surgery

## 2019-12-28 NOTE — Telephone Encounter (Signed)
Called pt to discuss her concerns with the shape/appearance of her bilateral nipples She reports that both nipples appear "indented"- she denies any drainage/redness & only minimal pain No n/v & no chills or fever. She is also asking when she can remove the breast binder and shower I consulted with Dr.Dillingham about pt's concerns Per Dr. Ulice Bold- the breast shape & appearance is normal for the procedure- she wants the pt to not shower until her next f/u - d/t the drains- but if pt insists she can shower 3 days from now- & then reapply the dressings and the binder Pt understands the plan of care-& she will call for any change/concerns

## 2019-12-28 NOTE — Telephone Encounter (Signed)
Patient called in because she said that her "nipples are sunk in" on both sides and "looks like a donut". She said she was not expecting it to look like it does. Please call patient to advise if this is normal or if she should come in for an appt. Her surgery was yesterday.

## 2020-01-04 ENCOUNTER — Encounter: Payer: Self-pay | Admitting: Plastic Surgery

## 2020-01-04 ENCOUNTER — Other Ambulatory Visit: Payer: Self-pay

## 2020-01-04 ENCOUNTER — Ambulatory Visit (INDEPENDENT_AMBULATORY_CARE_PROVIDER_SITE_OTHER): Payer: Medicare Other | Admitting: Plastic Surgery

## 2020-01-04 VITALS — BP 95/65 | HR 83 | Temp 97.1°F | Ht 62.0 in | Wt <= 1120 oz

## 2020-01-04 DIAGNOSIS — T8543XA Leakage of breast prosthesis and implant, initial encounter: Secondary | ICD-10-CM

## 2020-01-04 NOTE — Progress Notes (Signed)
   Subjective:    Patient ID: Terry Ball, female    DOB: [birth year], 69 y.o.   MRN: [id number]  The patient is a 69 year old here for follow-up on her breast surgery last week.  She underwent removal of ruptured implants she had a mastopexy at she is doing very well.  She is very pleased that she not have implants placed.  The left NAC is more swollen then the right side.  She has swelling and bruising as expected.  No sign of hematoma or seroma.  Her drain output has been minimal.  The incisions are healing nicely.   Review of Systems  Constitutional: Negative.   Respiratory: Negative.   Cardiovascular: Negative.   Genitourinary: Negative.   Musculoskeletal: Negative.   Hematological: Negative.   Psychiatric/Behavioral: Negative.        Objective:   Physical Exam Vitals and nursing note reviewed.  Constitutional:      Appearance: Normal appearance.  Cardiovascular:     Rate and Rhythm: Normal rate.     Pulses: Normal pulses.  Pulmonary:     Effort: Pulmonary effort is normal.  Neurological:     General: No focal deficit present.     Mental Status: She is alert. Mental status is at baseline.  Psychiatric:        Mood and Affect: Mood normal.        Behavior: Behavior normal.        Assessment & Plan:     ICD-10-CM   1. Ruptured silicone breast implant, initial encounter  T85.43XA     The drains were removed.  Continue with the breast binder.  I would like to see her back in 1-2 weeks. She can shower.

## 2020-01-07 ENCOUNTER — Telehealth: Payer: Self-pay | Admitting: *Deleted

## 2020-01-07 NOTE — Telephone Encounter (Signed)
Received DME Standard Written Order Georgana Curio) via of fax from [facility] [place].  Requesting signature.  Given to provider to complete.    Order signed and faxed back to [facility] [place].  Confirmation received and copy scanned into the chart.//AB/CMA

## 2020-01-18 ENCOUNTER — Ambulatory Visit (INDEPENDENT_AMBULATORY_CARE_PROVIDER_SITE_OTHER): Payer: Medicare Other | Admitting: Surgical

## 2020-01-18 ENCOUNTER — Other Ambulatory Visit: Payer: Self-pay

## 2020-01-18 ENCOUNTER — Encounter: Payer: Self-pay | Admitting: Surgical

## 2020-01-18 VITALS — BP 108/71 | HR 82 | Temp 97.1°F

## 2020-01-18 DIAGNOSIS — T8543XD Leakage of breast prosthesis and implant, subsequent encounter: Secondary | ICD-10-CM

## 2020-01-18 NOTE — Progress Notes (Signed)
Patient is a 69 year old female here for follow-up after removal of ruptured implants and mastopexy with Dr. Ulice Bold approximately 4 weeks ago.  She is doing really well.  She is pleased with her progress at this time.  Chaperone present on exam Bilateral NAC's with good color, good cap refill, no wounds noted.  Steri-Strips intact along vertical limb.  No drainage noted.  Bilateral breasts are soft, little bit of swelling left greater than right.  Possible small seroma noted, no tenderness or pain noted.  No erythema.  Continue to wear sports bra for 6 weeks 24/7 Follow-up scheduled for 2 months, discussed calling to be seen sooner if she develops any increased swelling of left breast or increased pain.  No sign of infection. Pictures were obtained of the patient and placed in the chart with the patient's or guardian's permission.

## 2020-02-05 ENCOUNTER — Telehealth: Payer: Self-pay

## 2020-02-05 NOTE — Telephone Encounter (Signed)
Patient called with questions about how long she should continue wearing the compression bra. She would like a call back to discuss.

## 2020-02-05 NOTE — Telephone Encounter (Signed)
Left message on VM for patient.  This Thursday 7/8 is the last day she needs to wear the compression bra 24/7.  After that she can wear it during the day or switch to a regular (recommend non-underwire) bra if she likes.

## 2020-03-11 ENCOUNTER — Other Ambulatory Visit: Payer: Self-pay

## 2020-03-11 ENCOUNTER — Encounter: Payer: Self-pay | Admitting: Plastic Surgery

## 2020-03-11 ENCOUNTER — Ambulatory Visit (INDEPENDENT_AMBULATORY_CARE_PROVIDER_SITE_OTHER): Payer: Medicare Other | Admitting: Plastic Surgery

## 2020-03-11 VITALS — BP 103/65 | HR 72 | Temp 98.1°F

## 2020-03-11 DIAGNOSIS — T8543XD Leakage of breast prosthesis and implant, subsequent encounter: Secondary | ICD-10-CM

## 2020-03-11 NOTE — Progress Notes (Signed)
   Subjective:    Patient ID: Terry Ball, female    DOB: [birth year], 69 y.o.   MRN: [id number]  The patient is a 69 year old female here for follow-up after undergoing a mastopexy with removal of her implants that were ruptured.  Overall she is doing extremely well.  There is a slight bit more volume in the left compared to the right.  It does not appear that she has a seroma or hematoma.  There is still a little bit of bruising.  No sign of redness and no sign of infection.     Review of Systems  Constitutional: Negative.   HENT: Negative.   Eyes: Negative.   Respiratory: Negative.   Cardiovascular: Negative.   Gastrointestinal: Negative.   Genitourinary: Negative.   Musculoskeletal: Negative.        Objective:   Physical Exam Vitals and nursing note reviewed.  Constitutional:      Appearance: Normal appearance.  HENT:     Head: Normocephalic and atraumatic.  Cardiovascular:     Rate and Rhythm: Normal rate.     Pulses: Normal pulses.  Abdominal:     General: Abdomen is flat.  Neurological:     General: No focal deficit present.     Mental Status: She is alert and oriented to person, place, and time.  Psychiatric:        Mood and Affect: Mood normal.        Behavior: Behavior normal.        Thought Content: Thought content normal.       Assessment & Plan:     ICD-10-CM   1. Ruptured silicone breast implant, subsequent encounter  T85.43XD    Overall the patient has done well.  She is pleased with her results.  She is very happy she had the implants removed.  [facility].  I would like to see her back in 2 to 3 months.  Pictures were obtained of the patient and placed in the chart with the patient's or guardian's permission.

## 2020-03-13 ENCOUNTER — Telehealth: Payer: Self-pay | Admitting: *Deleted

## 2020-03-13 NOTE — Telephone Encounter (Signed)
Received on (03/11/2020) via of fax DME Standard Written Order from [facility] [place] requesting signature.  Given to provider to complete.  Order signed and faxed to [facility] [place].  Confirmation received and copy scanned into the chart.//AB/CMA

## 2020-03-20 ENCOUNTER — Telehealth: Payer: Self-pay | Admitting: *Deleted

## 2020-03-20 NOTE — Telephone Encounter (Signed)
Received on (03/17/20) via of fax DME Standard Written Order from [facility] [place].  Requesting signature and return.  Given to provider to sign.   Order signed and faxed back to [facility] [place].  Confirmation received and copy scanned into the chart.//AB/CMA

## 2020-04-28 ENCOUNTER — Telehealth: Payer: Self-pay | Admitting: *Deleted

## 2020-04-28 NOTE — Telephone Encounter (Signed)
Received DME Standard Written Order Georgana Curio) on (04/24/20) via of fax from [facility] [place].  Requesting signature and return.  Given to provider to sign.  Order signed and faxed back to [facility] [place].   Confirmation received and copy scanned into the chart.//AB/CMA

## 2020-06-13 ENCOUNTER — Ambulatory Visit: Payer: Medicare Other | Admitting: Plastic Surgery

## 2020-07-15 ENCOUNTER — Ambulatory Visit: Payer: Medicare Other | Admitting: Plastic Surgery

## 2020-08-08 ENCOUNTER — Ambulatory Visit: Payer: Medicare Other | Admitting: Plastic Surgery

## 2020-08-22 ENCOUNTER — Ambulatory Visit: Payer: Medicare Other | Admitting: Plastic Surgery

## 2020-08-26 ENCOUNTER — Other Ambulatory Visit: Payer: Self-pay

## 2020-08-26 ENCOUNTER — Encounter: Payer: Self-pay | Admitting: Surgical

## 2020-08-26 ENCOUNTER — Ambulatory Visit (INDEPENDENT_AMBULATORY_CARE_PROVIDER_SITE_OTHER): Payer: Self-pay | Admitting: Surgical

## 2020-08-26 VITALS — BP 96/63 | HR 80

## 2020-08-26 DIAGNOSIS — T8543XD Leakage of breast prosthesis and implant, subsequent encounter: Secondary | ICD-10-CM

## 2020-08-26 NOTE — Progress Notes (Signed)
   Subjective:     Patient ID: Terry Ball, female    DOB: [birth year], 70 y.o.   MRN: [id number]  Chief Complaint  Patient presents with  . Follow-up    HPI: The patient is a 70 y.o. female here for follow-up after removal of bilateral ruptured implants, capsulectomy and bilateral mastopexy by Dr. Ulice Bold on 12/27/2019.  She reports that she has had some ongoing tenderness in the right breast greater than left, the left breast is doing well.  The right breast she has some scarring that is causing some distortion of her breast.  She reports that it was present just shortly after surgery.  She previously discussed this with Dr. Ulice Bold and she is here for reevaluation of this.  She reports that pain has improved, it is still present with palpation, but is not constant.  She also reports that it is difficult for her to find a bra due to difference in size of her breasts.  She reports that she is currently being worked up for dizziness, she recently had a CT scan but is unsure of the results just yet.  She is otherwise doing well.  Review of Systems  Constitutional: Negative.   Skin: Negative.      Objective:   Vital Signs BP 96/63 (BP Location: Left Arm, Patient Position: Sitting, Cuff Size: Normal)   Pulse 80   SpO2 99%  Vital Signs and Nursing Note Reviewed Chaperone present Physical Exam Constitutional:      General: She is not in acute distress.    Appearance: Normal appearance. She is not ill-appearing.  HENT:     Head: Normocephalic.  Pulmonary:     Effort: Pulmonary effort is normal.  Chest:  Breasts:     Right: Tenderness present. No bleeding, inverted nipple, nipple discharge or skin change.     Left: No bleeding, inverted nipple, nipple discharge or skin change.      Comments: Bilateral breast incisions are intact, bilateral NAC's are viable, no wounds noted.  No erythema noted.  She does have some scarring along the right breast superior pole that is causing  distortion of her breast -this has been present and is noticeable on previous exam EMR photos.  There is no erythema.  She is not having any nipple discharge.  She does have some asymmetry with the left breast being more full and larger. Neurological:     General: No focal deficit present.     Mental Status: She is alert and oriented to person, place, and time.  Psychiatric:        Mood and Affect: Mood normal.        Behavior: Behavior normal.       Assessment/Plan:     ICD-10-CM   1. Ruptured silicone breast implant, subsequent encounter  T85.43XD     Recommend massaging the right breast scarring, monitoring for changes.  She did have a mammogram in March 2021 which was negative.  She is not having any nipple discharge or overlying skin changes.  Suspect this is likely scarring from her removal of ruptured implants, capsulectomy and mastopexy.  I would like her to follow-up in 6 months for reevaluation, she knows to call with any questions or concerns or if her symptoms worsen.    Recommend calling with any questions or concerns. Pictures were obtained of the patient and placed in the chart with the patient's or guardian's permission.   Kermit Balo Donyell Carrell, PA-C 08/26/2020, 2:03 PM

## 2021-01-14 ENCOUNTER — Other Ambulatory Visit: Payer: Self-pay | Admitting: Gastroenterology

## 2021-01-14 DIAGNOSIS — R109 Unspecified abdominal pain: Secondary | ICD-10-CM

## 2021-01-19 ENCOUNTER — Other Ambulatory Visit: Payer: Medicare Other

## 2021-02-16 ENCOUNTER — Ambulatory Visit
Admission: RE | Admit: 2021-02-16 | Discharge: 2021-02-16 | Disposition: A | Discharge status: Home / Self Care | Payer: Medicare Other | Source: Ambulatory Visit | Attending: Gastroenterology | Admitting: Gastroenterology

## 2021-02-16 DIAGNOSIS — R109 Unspecified abdominal pain: Secondary | ICD-10-CM

## 2021-02-24 ENCOUNTER — Other Ambulatory Visit: Payer: Self-pay

## 2021-02-24 ENCOUNTER — Ambulatory Visit (INDEPENDENT_AMBULATORY_CARE_PROVIDER_SITE_OTHER): Payer: Medicare Other | Admitting: Plastic Surgery

## 2021-02-24 ENCOUNTER — Encounter: Payer: Self-pay | Admitting: Plastic Surgery

## 2021-02-24 ENCOUNTER — Ambulatory Visit: Payer: Medicare Other | Admitting: Plastic Surgery

## 2021-02-24 DIAGNOSIS — T8543XD Leakage of breast prosthesis and implant, subsequent encounter: Secondary | ICD-10-CM

## 2021-02-24 NOTE — Progress Notes (Signed)
   Subjective:    Patient ID: Terry Ball, female    DOB: [birth year], 70 y.o.   MRN: [id number]  The patient is a 70 year old female here with her daughters.  She is complaining of a little tenderness in her right chest area that appears to be identifiable with 1 finger and is on the rib.  Her implants were removed due to rupture as well as the capsules.  She now thinks she is got asymmetry.  She does have a little asymmetry with the left breast slightly larger.  She has less asymmetry now than she had preoperatively.  She could have implants put in but does not really want to do that.  She could have fat grafting.  She has recently suffered from a stroke and is in rehab for that.  I do not feel any lumps or bumps that are concerning.  She has loss of upper pole fullness on both sides but worse on the right.     Review of Systems  Constitutional:  Positive for activity change and appetite change.  HENT: Negative.    Eyes: Negative.   Respiratory:  Negative for shortness of breath.   Cardiovascular:  Negative for leg swelling.  Gastrointestinal: Negative.   Genitourinary: Negative.   Musculoskeletal: Negative.   Neurological: Negative.   Hematological: Negative.       Objective:   Physical Exam Vitals and nursing note reviewed.  Constitutional:      Appearance: Normal appearance.  Cardiovascular:     Rate and Rhythm: Normal rate.     Pulses: Normal pulses.  Pulmonary:     Effort: Pulmonary effort is normal.  Abdominal:     General: Abdomen is flat.  Skin:    General: Skin is warm.     Capillary Refill: Capillary refill takes less than 2 seconds.  Neurological:     Mental Status: She is alert and oriented to person, place, and time.  Psychiatric:        Mood and Affect: Mood normal.        Behavior: Behavior normal.       Assessment & Plan:     ICD-10-CM   1. Ruptured silicone breast implant, subsequent encounter  T85.43XD      We discussed fat grafting as a  possibility.  I would not recommend any surgery this year due to her stroke.  This would also not be covered by insurance.  Another option is to have the left side made smaller but she does not want to do that.  [facility]  They have the ability to make implants for her bra that would give her better symmetry.  I have agreed to see her back in 6 to 9 months.  Pictures were obtained of the patient and placed in the chart with the patient's or guardian's permission.

## 2021-08-28 ENCOUNTER — Ambulatory Visit: Payer: Medicare Other | Admitting: Plastic Surgery

## 2021-12-15 HISTORY — PX: BREAST BIOPSY: SHX20

## 2022-01-28 ENCOUNTER — Other Ambulatory Visit: Payer: Self-pay | Admitting: General Surgery

## 2022-01-28 ENCOUNTER — Telehealth: Payer: Self-pay | Admitting: *Deleted

## 2022-01-28 DIAGNOSIS — N6091 Unspecified benign mammary dysplasia of right breast: Secondary | ICD-10-CM

## 2022-01-28 NOTE — Telephone Encounter (Signed)
Received on (11/17/21) via of fax DME Standard Written Order Georgana Curio) from [facility] [place].  Requesting signature and return.  Given to provider to sign and return.  DME Standard Written Order signed and faxed to [facility].  Confirmation received and copy scanned into the chart.//AB/CMA

## 2022-02-01 ENCOUNTER — Telehealth: Payer: Self-pay | Admitting: *Deleted

## 2022-02-01 NOTE — Telephone Encounter (Signed)
Received on (11/19/21) via of fax DME Standard Written Order from [facility] [place] requesting signature and return.  Given to provider to sign.  DME Standard Written Order signed and faxed back to [facility] [place].  Confirmation received and copy scanned into the chart.//AB/CMA

## 2022-02-04 ENCOUNTER — Other Ambulatory Visit: Payer: Self-pay

## 2022-02-04 ENCOUNTER — Encounter (HOSPITAL_BASED_OUTPATIENT_CLINIC_OR_DEPARTMENT_OTHER): Payer: Self-pay | Admitting: General Surgery

## 2022-02-08 ENCOUNTER — Encounter (HOSPITAL_BASED_OUTPATIENT_CLINIC_OR_DEPARTMENT_OTHER)
Admission: RE | Admit: 2022-02-08 | Discharge: 2022-02-08 | Disposition: A | Discharge status: Home / Self Care | Payer: Medicare Other | Source: Ambulatory Visit | Attending: General Surgery | Admitting: General Surgery

## 2022-02-08 DIAGNOSIS — Z01812 Encounter for preprocedural laboratory examination: Secondary | ICD-10-CM | POA: Insufficient documentation

## 2022-02-08 MED ORDER — ENSURE PRE-SURGERY PO LIQD: 296.0000 mL | Freq: Once | ORAL | Status: DC

## 2022-02-08 NOTE — Progress Notes (Signed)

## 2022-02-11 ENCOUNTER — Other Ambulatory Visit: Payer: Self-pay | Admitting: General Surgery

## 2022-02-11 DIAGNOSIS — N6091 Unspecified benign mammary dysplasia of right breast: Secondary | ICD-10-CM

## 2022-02-12 ENCOUNTER — Ambulatory Visit
Admission: RE | Admit: 2022-02-12 | Discharge: 2022-02-12 | Disposition: A | Discharge status: Home / Self Care | Payer: Medicare Other | Source: Ambulatory Visit | Attending: General Surgery | Admitting: General Surgery

## 2022-02-12 DIAGNOSIS — N6091 Unspecified benign mammary dysplasia of right breast: Secondary | ICD-10-CM

## 2022-02-16 ENCOUNTER — Encounter (HOSPITAL_BASED_OUTPATIENT_CLINIC_OR_DEPARTMENT_OTHER): Payer: Self-pay | Admitting: General Surgery

## 2022-02-16 ENCOUNTER — Ambulatory Visit
Admission: RE | Admit: 2022-02-16 | Discharge: 2022-02-16 | Disposition: A | Discharge status: Home / Self Care | Payer: Medicare Other | Source: Ambulatory Visit | Attending: General Surgery | Admitting: General Surgery

## 2022-02-16 ENCOUNTER — Encounter (HOSPITAL_BASED_OUTPATIENT_CLINIC_OR_DEPARTMENT_OTHER)
Admission: RE | Disposition: A | Discharge status: Home / Self Care | Payer: Self-pay | Source: Home / Self Care | Attending: General Surgery

## 2022-02-16 ENCOUNTER — Other Ambulatory Visit: Payer: Self-pay

## 2022-02-16 ENCOUNTER — Ambulatory Visit (HOSPITAL_BASED_OUTPATIENT_CLINIC_OR_DEPARTMENT_OTHER): Payer: Medicare Other | Admitting: Certified Registered"

## 2022-02-16 ENCOUNTER — Ambulatory Visit (HOSPITAL_BASED_OUTPATIENT_CLINIC_OR_DEPARTMENT_OTHER)
Admission: RE | Admit: 2022-02-16 | Discharge: 2022-02-16 | Disposition: A | Discharge status: Home / Self Care | Payer: Medicare Other | Attending: General Surgery | Admitting: General Surgery

## 2022-02-16 DIAGNOSIS — F418 Other specified anxiety disorders: Secondary | ICD-10-CM | POA: Diagnosis not present

## 2022-02-16 DIAGNOSIS — M797 Fibromyalgia: Secondary | ICD-10-CM | POA: Insufficient documentation

## 2022-02-16 DIAGNOSIS — K219 Gastro-esophageal reflux disease without esophagitis: Secondary | ICD-10-CM | POA: Diagnosis not present

## 2022-02-16 DIAGNOSIS — N6021 Fibroadenosis of right breast: Secondary | ICD-10-CM | POA: Insufficient documentation

## 2022-02-16 DIAGNOSIS — Z87891 Personal history of nicotine dependence: Secondary | ICD-10-CM | POA: Diagnosis not present

## 2022-02-16 DIAGNOSIS — N62 Hypertrophy of breast: Secondary | ICD-10-CM | POA: Diagnosis not present

## 2022-02-16 DIAGNOSIS — K449 Diaphragmatic hernia without obstruction or gangrene: Secondary | ICD-10-CM | POA: Insufficient documentation

## 2022-02-16 DIAGNOSIS — Z8673 Personal history of transient ischemic attack (TIA), and cerebral infarction without residual deficits: Secondary | ICD-10-CM | POA: Insufficient documentation

## 2022-02-16 DIAGNOSIS — F32A Depression, unspecified: Secondary | ICD-10-CM | POA: Diagnosis not present

## 2022-02-16 DIAGNOSIS — I471 Supraventricular tachycardia: Secondary | ICD-10-CM | POA: Insufficient documentation

## 2022-02-16 DIAGNOSIS — Z01818 Encounter for other preprocedural examination: Secondary | ICD-10-CM

## 2022-02-16 DIAGNOSIS — N6091 Unspecified benign mammary dysplasia of right breast: Secondary | ICD-10-CM

## 2022-02-16 DIAGNOSIS — N6011 Diffuse cystic mastopathy of right breast: Secondary | ICD-10-CM | POA: Insufficient documentation

## 2022-02-16 DIAGNOSIS — N6310 Unspecified lump in the right breast, unspecified quadrant: Secondary | ICD-10-CM

## 2022-02-16 HISTORY — PX: RADIOACTIVE SEED GUIDED EXCISIONAL BREAST BIOPSY: SHX6490

## 2022-02-16 HISTORY — DX: Polyneuropathy, unspecified: G62.9

## 2022-02-16 SURGERY — RADIOACTIVE SEED GUIDED BREAST BIOPSY
Anesthesia: General | Site: Breast | Laterality: Right

## 2022-02-16 MED ORDER — FENTANYL CITRATE (PF) 100 MCG/2ML IJ SOLN
INTRAMUSCULAR | Status: AC
  Filled 2022-02-16: qty 2

## 2022-02-16 MED ORDER — DEXAMETHASONE SODIUM PHOSPHATE 10 MG/ML IJ SOLN
INTRAMUSCULAR | Status: DC | PRN
  Administered 2022-02-16: 10 mg via INTRAVENOUS

## 2022-02-16 MED ORDER — CHLORHEXIDINE GLUCONATE CLOTH 2 % EX PADS: 6.0000 | MEDICATED_PAD | Freq: Once | CUTANEOUS | Status: DC

## 2022-02-16 MED ORDER — PROPOFOL 10 MG/ML IV BOLUS
INTRAVENOUS | Status: DC | PRN
  Administered 2022-02-16: 170 mg via INTRAVENOUS

## 2022-02-16 MED ORDER — FENTANYL CITRATE (PF) 100 MCG/2ML IJ SOLN
INTRAMUSCULAR | Status: DC | PRN
  Administered 2022-02-16: 50 ug via INTRAVENOUS

## 2022-02-16 MED ORDER — FENTANYL CITRATE (PF) 100 MCG/2ML IJ SOLN
25.0000 ug | INTRAMUSCULAR | Status: DC | PRN
  Administered 2022-02-16: 25 ug via INTRAVENOUS
  Administered 2022-02-16: 50 ug via INTRAVENOUS

## 2022-02-16 MED ORDER — CEFAZOLIN SODIUM-DEXTROSE 2-4 GM/100ML-% IV SOLN
INTRAVENOUS | Status: AC
  Filled 2022-02-16: qty 100

## 2022-02-16 MED ORDER — PROPOFOL 500 MG/50ML IV EMUL
INTRAVENOUS | Status: DC | PRN
  Administered 2022-02-16: 150 ug/kg/min via INTRAVENOUS

## 2022-02-16 MED ORDER — LACTATED RINGERS IV SOLN: INTRAVENOUS | Status: DC

## 2022-02-16 MED ORDER — ACETAMINOPHEN 500 MG PO TABS
ORAL_TABLET | ORAL | Status: AC
  Filled 2022-02-16: qty 2

## 2022-02-16 MED ORDER — ACETAMINOPHEN 500 MG PO TABS
1000.0000 mg | ORAL_TABLET | Freq: Once | ORAL | Status: AC
Start: 2022-02-16 — End: 2022-02-16
  Administered 2022-02-16: 1000 mg via ORAL

## 2022-02-16 MED ORDER — BUPIVACAINE HCL (PF) 0.25 % IJ SOLN
INTRAMUSCULAR | Status: DC | PRN
  Administered 2022-02-16: 10 mL

## 2022-02-16 MED ORDER — CEFAZOLIN SODIUM-DEXTROSE 2-4 GM/100ML-% IV SOLN
2.0000 g | INTRAVENOUS | Status: AC
  Administered 2022-02-16: 2 g via INTRAVENOUS

## 2022-02-16 MED ORDER — LIDOCAINE 2% (20 MG/ML) 5 ML SYRINGE
INTRAMUSCULAR | Status: AC
  Filled 2022-02-16: qty 10

## 2022-02-16 MED ORDER — PHENYLEPHRINE HCL (PRESSORS) 10 MG/ML IV SOLN
INTRAVENOUS | Status: DC | PRN
  Administered 2022-02-16 (×2): 160 ug via INTRAVENOUS
  Administered 2022-02-16: 80 ug via INTRAVENOUS

## 2022-02-16 MED ORDER — ONDANSETRON HCL 4 MG/2ML IJ SOLN
INTRAMUSCULAR | Status: DC | PRN
  Administered 2022-02-16: 4 mg via INTRAVENOUS

## 2022-02-16 SURGICAL SUPPLY — 60 items
ADH SKN CLS APL DERMABOND .7 (GAUZE/BANDAGES/DRESSINGS) ×1
APL PRP STRL LF DISP 70% ISPRP (MISCELLANEOUS) ×1
APPLIER CLIP 9.375 MED OPEN (MISCELLANEOUS)
APR CLP MED 9.3 20 MLT OPN (MISCELLANEOUS)
BINDER BREAST LRG (GAUZE/BANDAGES/DRESSINGS) ×1 IMPLANT
BINDER BREAST MEDIUM (GAUZE/BANDAGES/DRESSINGS) IMPLANT
BINDER BREAST XLRG (GAUZE/BANDAGES/DRESSINGS) IMPLANT
BINDER BREAST XXLRG (GAUZE/BANDAGES/DRESSINGS) IMPLANT
BLADE SURG 15 STRL LF DISP TIS (BLADE) ×1 IMPLANT
BLADE SURG 15 STRL SS (BLADE) ×2
CANISTER SUC SOCK COL 7IN (MISCELLANEOUS) IMPLANT
CANISTER SUCT 1200ML W/VALVE (MISCELLANEOUS) IMPLANT
CHLORAPREP W/TINT 26 (MISCELLANEOUS) ×2 IMPLANT
CLIP APPLIE 9.375 MED OPEN (MISCELLANEOUS) IMPLANT
CLIP TI WIDE RED SMALL 6 (CLIP) IMPLANT
COVER BACK TABLE 60X90IN (DRAPES) ×2 IMPLANT
COVER MAYO STAND STRL (DRAPES) ×2 IMPLANT
COVER PROBE W GEL 5X96 (DRAPES) ×2 IMPLANT
DERMABOND ADVANCED (GAUZE/BANDAGES/DRESSINGS) ×1
DERMABOND ADVANCED .7 DNX12 (GAUZE/BANDAGES/DRESSINGS) ×1 IMPLANT
DRAPE LAPAROSCOPIC ABDOMINAL (DRAPES) ×2 IMPLANT
DRAPE UTILITY XL STRL (DRAPES) ×2 IMPLANT
DRSG TEGADERM 4X4.75 (GAUZE/BANDAGES/DRESSINGS) IMPLANT
ELECT COATED BLADE 2.86 ST (ELECTRODE) ×2 IMPLANT
ELECT REM PT RETURN 9FT ADLT (ELECTROSURGICAL) ×2
ELECTRODE REM PT RTRN 9FT ADLT (ELECTROSURGICAL) ×1 IMPLANT
GAUZE SPONGE 4X4 12PLY STRL LF (GAUZE/BANDAGES/DRESSINGS) IMPLANT
GLOVE BIO SURGEON STRL SZ7 (GLOVE) ×3 IMPLANT
GLOVE BIOGEL PI IND STRL 6.5 (GLOVE) IMPLANT
GLOVE BIOGEL PI IND STRL 7.0 (GLOVE) IMPLANT
GLOVE BIOGEL PI IND STRL 7.5 (GLOVE) ×1 IMPLANT
GLOVE BIOGEL PI INDICATOR 6.5 (GLOVE) ×1
GLOVE BIOGEL PI INDICATOR 7.0 (GLOVE) ×1
GLOVE BIOGEL PI INDICATOR 7.5 (GLOVE) ×1
GOWN STRL REUS W/ TWL LRG LVL3 (GOWN DISPOSABLE) ×2 IMPLANT
GOWN STRL REUS W/TWL LRG LVL3 (GOWN DISPOSABLE) ×4
HEMOSTAT ARISTA ABSORB 3G PWDR (HEMOSTASIS) IMPLANT
KIT MARKER MARGIN INK (KITS) ×2 IMPLANT
NDL HYPO 25X1 1.5 SAFETY (NEEDLE) ×1 IMPLANT
NEEDLE HYPO 25X1 1.5 SAFETY (NEEDLE) ×2 IMPLANT
NS IRRIG 1000ML POUR BTL (IV SOLUTION) IMPLANT
PACK BASIN DAY SURGERY FS (CUSTOM PROCEDURE TRAY) ×2 IMPLANT
PENCIL SMOKE EVACUATOR (MISCELLANEOUS) ×2 IMPLANT
RETRACTOR ONETRAX LX 90X20 (MISCELLANEOUS) IMPLANT
SLEEVE SCD COMPRESS KNEE MED (STOCKING) ×2 IMPLANT
SPIKE FLUID TRANSFER (MISCELLANEOUS) IMPLANT
SPONGE T-LAP 4X18 ~~LOC~~+RFID (SPONGE) ×2 IMPLANT
STRIP CLOSURE SKIN 1/2X4 (GAUZE/BANDAGES/DRESSINGS) ×2 IMPLANT
SUT MNCRL AB 4-0 PS2 18 (SUTURE) IMPLANT
SUT MON AB 5-0 PS2 18 (SUTURE) ×1 IMPLANT
SUT SILK 2 0 SH (SUTURE) IMPLANT
SUT VIC AB 2-0 SH 27 (SUTURE) ×4
SUT VIC AB 2-0 SH 27XBRD (SUTURE) ×1 IMPLANT
SUT VIC AB 3-0 SH 27 (SUTURE) ×2
SUT VIC AB 3-0 SH 27X BRD (SUTURE) ×1 IMPLANT
SYR CONTROL 10ML LL (SYRINGE) ×2 IMPLANT
TOWEL GREEN STERILE FF (TOWEL DISPOSABLE) ×2 IMPLANT
TRAY FAXITRON CT DISP (TRAY / TRAY PROCEDURE) ×2 IMPLANT
TUBE CONNECTING 20X1/4 (TUBING) IMPLANT
YANKAUER SUCT BULB TIP NO VENT (SUCTIONS) IMPLANT

## 2022-02-16 NOTE — Discharge Instructions (Addendum)
[facility] Office Phone Number [phone]  POST OP INSTRUCTIONS Take 400 mg of ibuprofen every 8 hours or 650 mg tylenol every 6 hours for next 72 hours then as needed. Use ice several times daily also.  A prescription for pain medication may be given to you upon discharge.  Take your pain medication as prescribed, if needed.  If narcotic pain medicine is not needed, then you may take acetaminophen (Tylenol), naprosyn (Alleve) or ibuprofen (Advil) as needed. Take your usually prescribed medications unless otherwise directed If you need a refill on your pain medication, please contact your pharmacy.  They will contact our office to request authorization.  Prescriptions will not be filled after 5pm or on week-ends. You should eat very light the first 24 hours after surgery, such as soup, crackers, pudding, etc.  Resume your normal diet the day after surgery. Most patients will experience some swelling and bruising in the breast.  Ice packs and a good support bra will help.  Wear the breast binder provided or a sports bra for 72 hours day and night.  After that wear a sports bra during the day until you return to the office. Swelling and bruising can take several days to resolve.  It is common to experience some constipation if taking pain medication after surgery.  Increasing fluid intake and taking a stool softener will usually help or prevent this problem from occurring.  A mild laxative (Milk of Magnesia or Miralax) should be taken according to package directions if there are no bowel movements after 48 hours. I used skin glue on the incision, you may shower in 24 hours.  The glue will flake off over the next 2-3 weeks.  Any sutures or staples will be removed at the office during your follow-up visit. ACTIVITIES:  You may resume regular daily activities (gradually increasing) beginning the next day.  Wearing a good support bra or sports bra minimizes pain and swelling.  You may have  sexual intercourse when it is comfortable. You may drive when you no longer are taking prescription pain medication, you can comfortably wear a seatbelt, and you can safely maneuver your car and apply brakes. RETURN TO WORK:  ______________________________________________________________________________________ Terry Ball should see your doctor in the office for a follow-up appointment approximately two weeks after your surgery.  Your doctor's nurse will typically make your follow-up appointment when she calls you with your pathology report.  Expect your pathology report 3-4 business days after your surgery.  You may call to check if you do not hear from [place] after three days. OTHER INSTRUCTIONS: _______________________________________________________________________________________________ _____________________________________________________________________________________________________________________________________ _____________________________________________________________________________________________________________________________________ _____________________________________________________________________________________________________________________________________  WHEN TO CALL DR [place]KEFIELD: Fever over 101.0 Nausea and/or vomiting. Extreme swelling or bruising. Continued bleeding from incision. Increased pain, redness, or drainage from the incision.  The clinic staff is available to answer your questions during regular business hours.  Please don't hesitate to call and ask to speak to one of the nurses for clinical concerns.  If you have a medical emergency, go to the nearest emergency room or call 911.  A surgeon from [facility] is always on call at the hospital.  For further questions, please visit [email or web address]     Post Anesthesia Home Care Instructions  Activity: Get plenty of rest for the remainder of the day. A responsible individual must stay  with you for 24 hours following the procedure.  For the next 24 hours, DO NOT: -Drive a car -Advertising copywriter -Drink alcoholic beverages -Take any medication unless  instructed by your physician -Make any legal decisions or sign important papers.  Meals: Start with liquid foods such as gelatin or soup. Progress to regular foods as tolerated. Avoid greasy, spicy, heavy foods. If nausea and/or vomiting occur, drink only clear liquids until the nausea and/or vomiting subsides. Call your physician if vomiting continues.  Special Instructions/Symptoms: Your throat may feel dry or sore from the anesthesia or the breathing tube placed in your throat during surgery. If this causes discomfort, gargle with warm salt water. The discomfort should disappear within 24 hours.  If you had a scopolamine patch placed behind your ear for the management of post- operative nausea and/or vomiting:  1. The medication in the patch is effective for 72 hours, after which it should be removed.  Wrap patch in a tissue and discard in the trash. Wash hands thoroughly with soap and water. 2. You may remove the patch earlier than 72 hours if you experience unpleasant side effects which may include dry mouth, dizziness or visual disturbances. 3. Avoid touching the patch. Wash your hands with soap and water after contact with the patch.      Next dose of Tylenol can be given at 3:00pm if needed.

## 2022-02-16 NOTE — Anesthesia Postprocedure Evaluation (Signed)
Anesthesia Post Note  Patient: Terry Ball  Procedure(s) Performed: RIGHT BREAST SEED GUIDED EXCISIONAL BIOPSY (Right: Breast)     Patient location during evaluation: PACU Anesthesia Type: General Level of consciousness: awake and alert Pain management: pain level controlled Vital Signs Assessment: post-procedure vital signs reviewed and stable Respiratory status: spontaneous breathing, nonlabored ventilation, respiratory function stable and patient connected to nasal cannula oxygen Cardiovascular status: blood pressure returned to baseline and stable Postop Assessment: no apparent nausea or vomiting Anesthetic complications: no   No notable events documented.  Last Vitals:  Vitals:   02/16/22 1100 02/16/22 1115  BP: (!) 117/53 (!) 129/58  Pulse: 84 88  Resp: 12 16  Temp:  36.5 C  SpO2: 96% 96%    Last Pain:  Vitals:   02/16/22 1115  TempSrc:   PainSc: 1                  Krystale Rinkenberger L Knox Holdman

## 2022-02-16 NOTE — Anesthesia Procedure Notes (Signed)
Procedure Name: LMA Insertion Date/Time: 02/16/2022 9:53 AM  Performed by: Lauralyn Primes, CRNAPre-anesthesia Checklist: Patient identified, Emergency Drugs available, Suction available and Patient being monitored Patient Re-evaluated:Patient Re-evaluated prior to induction Oxygen Delivery Method: Circle system utilized Preoxygenation: Pre-oxygenation with 100% oxygen Induction Type: IV induction Ventilation: Mask ventilation without difficulty LMA: LMA inserted LMA Size: 4.0 Number of attempts: 1 Airway Equipment and Method: Bite block Placement Confirmation: positive ETCO2 Tube secured with: Tape Dental Injury: Teeth and Oropharynx as per pre-operative assessment

## 2022-02-16 NOTE — H&P (Signed)
  61 yof who had screening mammogram that shows b density breasts. She was found to have a subtle distortion over the upper outer quadrant right breast. Images of the left breast showed a persistent oval circumscribed 6 mm mass and a possible asymmetry. Targeted ultrasound showed a 5 mm simple cyst over the 5 o'clock position of the left breast, a probable benign asymmetry over the outer left breast and a subtle focal distortion in the right upper outer quadrant. The right upper outer quadrant lesion has undergone a biopsy. This finding was a radial scar and focal atypical ductal hyperplasia. She is referred for evaluation. She has no mass or discharge. She has no prior breast history except for implants that she has had removed in the last several years. She has no family history of breast cancer.  Review of Systems: A complete review of systems was obtained from the patient. I have reviewed this information and discussed as appropriate with the patient. See HPI as well for other ROS.  Review of Systems  HENT: Positive for tinnitus.  Cardiovascular: Positive for leg swelling.  Skin: Positive for rash.  Endo/Heme/Allergies: Bruises/bleeds easily.  All other systems reviewed and are negative.   Medical History: Past Medical History:  Diagnosis Date  Anxiety  Arthritis  GERD (gastroesophageal reflux disease)  Hyperlipidemia    Past Surgical History:  Procedure Laterality Date  BREAST IMPLANT REMOVAL  2021  JOINT REPLACEMENT   Allergies  Allergen Reactions  Codeine Nausea, Nausea And Vomiting, Vomiting and Unknown  REACTION: rash, itching, GI intolerance, GI upset  Morphine Nausea, Vomiting and Nausea And Vomiting   Current Outpatient Medications on File Prior to Visit  Medication Sig Dispense Refill  amitriptyline (ELAVIL) 50 MG tablet Take 1 tablet by mouth at bedtime  atenoloL (TENORMIN) 100 MG tablet Take by mouth  estradioL (ESTRACE) 0.5 MG tablet  lamoTRIgine (LAMICTAL) 25 MG  tablet  medroxyPROGESTERone (PROVERA) 2.5 MG tablet  omeprazole (PRILOSEC) 20 MG DR capsule  simvastatin (ZOCOR) 40 MG tablet Take 1 tablet by mouth once daily    Family History  Problem Relation Age of Onset  Skin cancer Mother  Skin cancer Brother  High blood pressure (Hypertension) Brother  Hyperlipidemia (Elevated cholesterol) Brother    Social History   Tobacco Use  Smoking Status Former  Types: Cigarettes  Smokeless Tobacco Never  Marital status: Widowed  Tobacco Use  Smoking status: Former  Types: Cigarettes  Smokeless tobacco: Never  Substance and Sexual Activity  Alcohol use: Not Currently  Drug use: Not Currently   Objective:   Vitals:  01/28/22 1008  BP: 100/68  Pulse: 78  Weight: 66.1 kg (145 lb 12.8 oz)  Height: 157.5 cm (5\' 2" )   Body mass index is 26.67 kg/m.  Physical Exam Vitals reviewed.  Chest:  Breasts: Right: No inverted nipple, mass or nipple discharge.  Left: No inverted nipple, mass or nipple discharge.  Comments: Well healed bilateral scars Lymphadenopathy:  Upper Body:  Right upper body: No supraclavicular or axillary adenopathy.  Left upper body: No supraclavicular or axillary adenopathy.  Neurological:  Mental Status: She is alert.    Assessment and Plan:   Atypical ductal hyperplasia of right breast  Right breast seed guided excisional biopsy  Due to adh and upgrade rate I think excisional biopsy is indicated. We discussed indications for that. We discussed surgery and recovery and will proceed.

## 2022-02-16 NOTE — Anesthesia Preprocedure Evaluation (Addendum)
Anesthesia Evaluation  Patient identified by MRN, date of birth, ID band Patient awake    Reviewed: Allergy & Precautions, NPO status , Patient's Chart, lab work & pertinent test results, reviewed documented beta blocker date and time   History of Anesthesia Complications (+) PONV and history of anesthetic complications  Airway Mallampati: III  TM Distance: >3 FB Neck ROM: Full  Mouth opening: Limited Mouth Opening  Dental no notable dental hx. (+) Teeth Intact, Dental Advisory Given   Pulmonary neg pulmonary ROS, former smoker,    Pulmonary exam normal breath sounds clear to auscultation       Cardiovascular Normal cardiovascular exam+ dysrhythmias Supra Ventricular Tachycardia  Rhythm:Regular Rate:Normal     Neuro/Psych PSYCHIATRIC DISORDERS Anxiety Depression TIA   GI/Hepatic Neg liver ROS, hiatal hernia, GERD  ,  Endo/Other  negative endocrine ROS  Renal/GU negative Renal ROS  negative genitourinary   Musculoskeletal  (+) Arthritis , Fibromyalgia -  Abdominal   Peds  Hematology negative hematology ROS (+)   Anesthesia Other Findings   Reproductive/Obstetrics                            Anesthesia Physical Anesthesia Plan  ASA: 3  Anesthesia Plan: General   Post-op Pain Management: Tylenol PO (pre-op)*   Induction: Intravenous  PONV Risk Score and Plan: 4 or greater and Ondansetron, Dexamethasone, TIVA and Treatment may vary due to age or medical condition  Airway Management Planned: LMA  Additional Equipment:   Intra-op Plan:   Post-operative Plan: Extubation in OR  Informed Consent: I have reviewed the patients History and Physical, chart, labs and discussed the procedure including the risks, benefits and alternatives for the proposed anesthesia with the patient or authorized representative who has indicated his/her understanding and acceptance.     Dental advisory  given  Plan Discussed with: CRNA  Anesthesia Plan Comments:         Anesthesia Quick Evaluation

## 2022-02-16 NOTE — Transfer of Care (Signed)
Immediate Anesthesia Transfer of Care Note  Patient: Terry Ball  Procedure(s) Performed: RIGHT BREAST SEED GUIDED EXCISIONAL BIOPSY (Right: Breast)  Patient Location: PACU  Anesthesia Type:General  Level of Consciousness: awake, alert  and oriented  Airway & Oxygen Therapy: Patient Spontanous Breathing and Patient connected to face mask oxygen  Post-op Assessment: Report given to RN and Post -op Vital signs reviewed and stable  Post vital signs: Reviewed and stable  Last Vitals:  Vitals Value Taken Time  BP 102/52 02/16/22 1027  Temp    Pulse 84 02/16/22 1028  Resp 16 02/16/22 1028  SpO2 99 % 02/16/22 1028  Vitals shown include unvalidated device data.  Last Pain:  Vitals:   02/16/22 0845  TempSrc: Oral  PainSc: 0-No pain      Patients Stated Pain Goal: 4 (02/16/22 0845)  Complications: No notable events documented.

## 2022-02-16 NOTE — Op Note (Signed)
Preoperative diagnosis: right breast mass with core biopsy showing adh Postoperative diagnosis: Same as above Procedure: Right breast radioactive seed guided excisional biopsy Surgeon: Dr. Harden Mo Anesthesia: General Estimated blood loss: Minimal Complications: None Drains: None Specimens: Right breast tissue containing seed and clip marked with paint Sponge needle count was correct at completion Disposition to recovery stable condition  Indications: 69 yof who had screening mammogram that shows b density breasts. She was found to have a subtle distortion over the upper outer quadrant right breast. Images of the left breast showed a persistent oval circumscribed 6 mm mass and a possible asymmetry. Targeted ultrasound showed a 5 mm simple cyst over the 5 o'clock position of the left breast, a probable benign asymmetry over the outer left breast and a subtle focal distortion in the right upper outer quadrant. The right upper outer quadrant lesion has undergone a biopsy. This finding was a radial scar and focal atypical ductal hyperplasia. We discussed options and elected to proceed with excision.   Procedure: After informed consent was obtained she first had a seed placed.  I had these mammograms available for my review.  She was given antibiotics.  SCDs were placed.  She was placed under general anesthesia without complication.  She was prepped and draped in the standard sterile surgical fashion.  A surgical timeout was then performed.  I located the seed which was in the uoq.   I then infiltrated Marcaine throughout this area.  I then made a periareolar incision in order to hide the scar later. This was overlying her old scar.  I then dissected the seed.  I remove the seed and the some of the surrounding tissue.  I confirm removal of the seed and clip with the Faxitron.  Hemostasis was observed.  I closed the breast tissue with 2-0 Vicryl.  The skin was closed with 3-0 Vicryl 5-0 Monocryl.   Glue and Steri-Strips were applied.  She tolerated this well and was transferred to recovery stable.

## 2022-02-17 ENCOUNTER — Encounter (HOSPITAL_BASED_OUTPATIENT_CLINIC_OR_DEPARTMENT_OTHER): Payer: Self-pay | Admitting: General Surgery

## 2022-02-17 LAB — SURGICAL PATHOLOGY

## 2022-03-03 ENCOUNTER — Encounter (HOSPITAL_COMMUNITY): Payer: Self-pay

## 2023-06-14 IMAGING — CT CT ABD-PELV W/O CM
1 of 2 series · 14 of 32 positions shown, 19 images · non-contrast
Comparison: MRI lumbar spine May 04, 2014.

CLINICAL DATA: Abdominal pain and weight loss.

EXAM:
CT ABDOMEN AND PELVIS WITHOUT CONTRAST
TECHNIQUE: Multidetector CT imaging of the abdomen and pelvis was performed
following the standard protocol without IV contrast.

[Series 2: abd/pelvis w/(date) · axial · 0.77mm/px · z∈[-411,-21]mm · 14 of 90 slices shown, 19 images]
[im 6/90  soft-tissue]
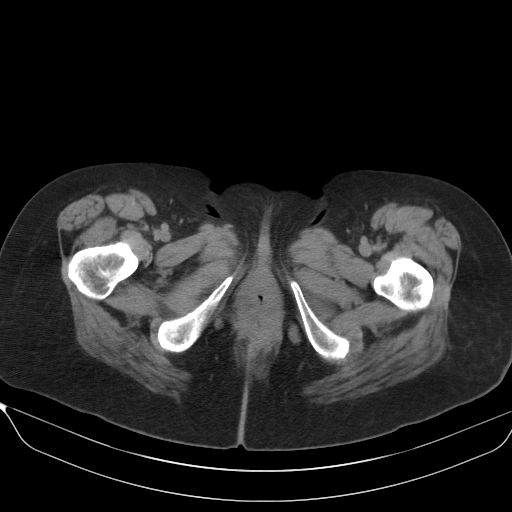
[im 6/90  bone]
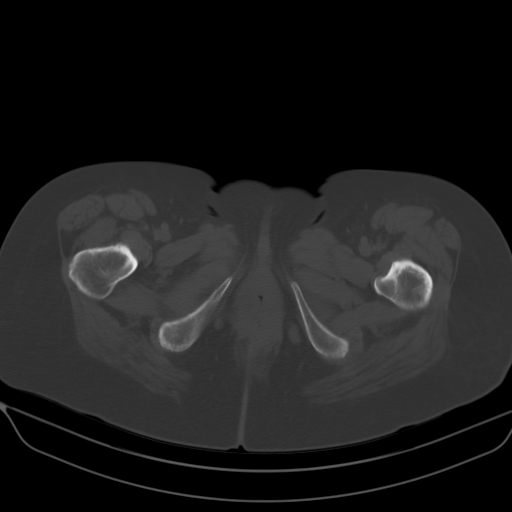
[im 12/90  soft-tissue]
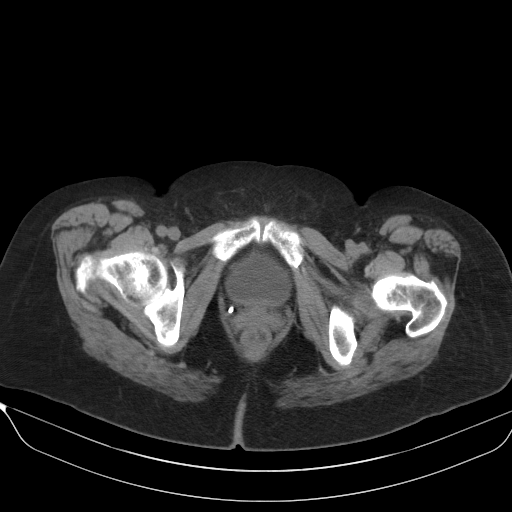
[im 17/90  soft-tissue]
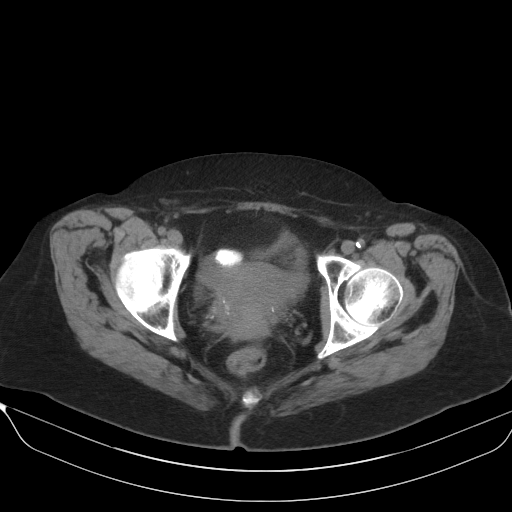
[im 28/90  soft-tissue]
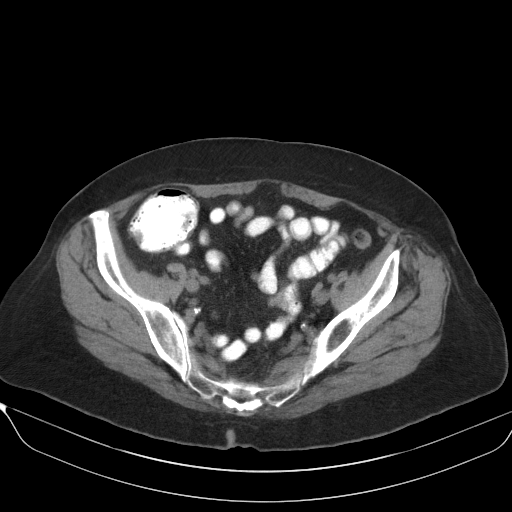
[im 34/90  soft-tissue]
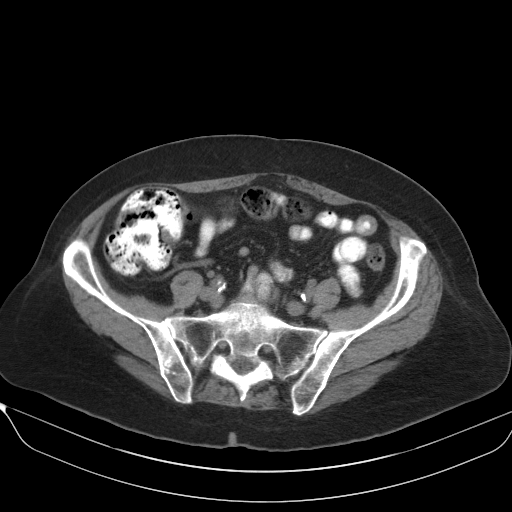
[im 39/90  soft-tissue]
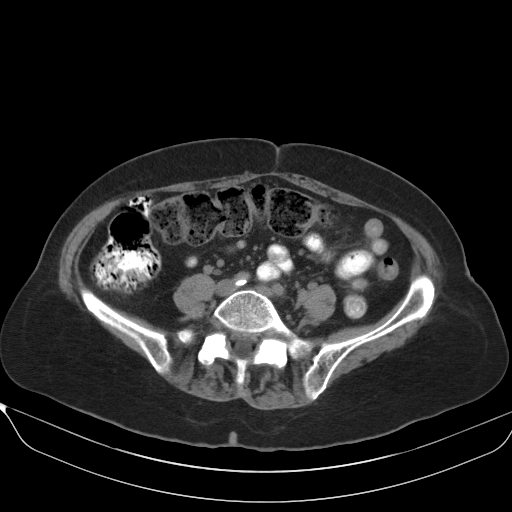
[im 45/90  soft-tissue]
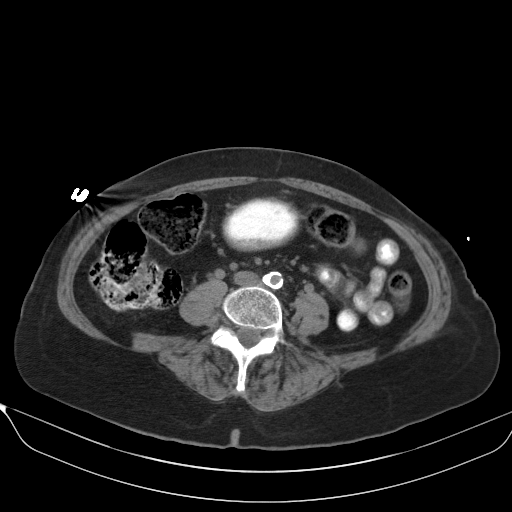
[im 51/90  soft-tissue]
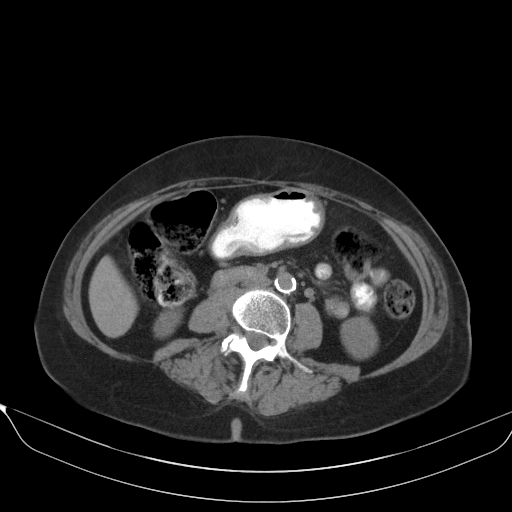
[im 56/90  soft-tissue]
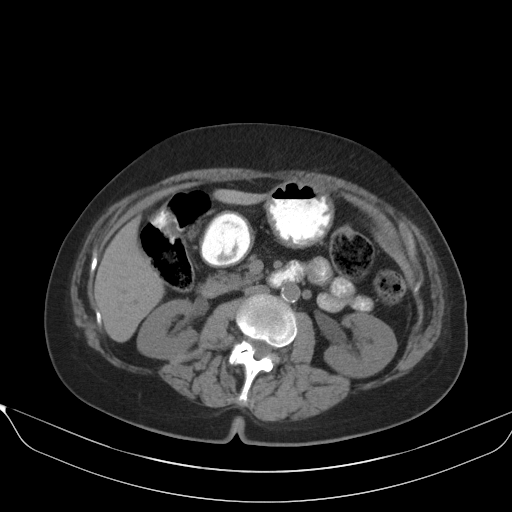
[im 56/90  bone]
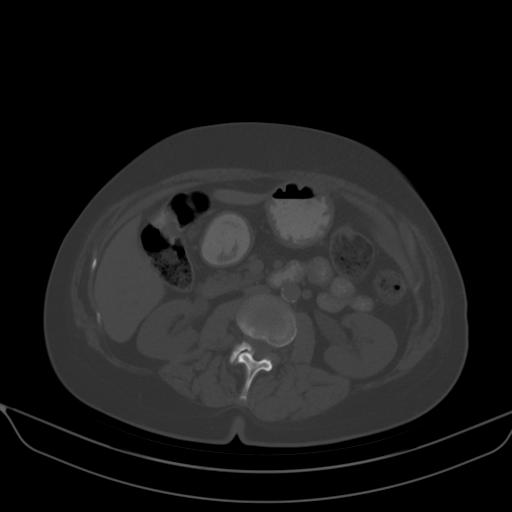
[im 62/90  soft-tissue]
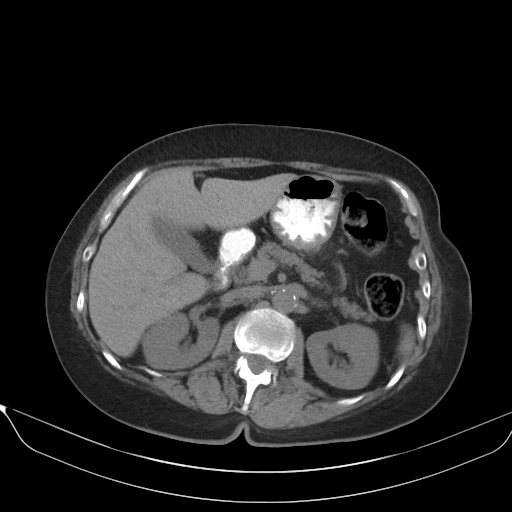
[im 67/90  lung]
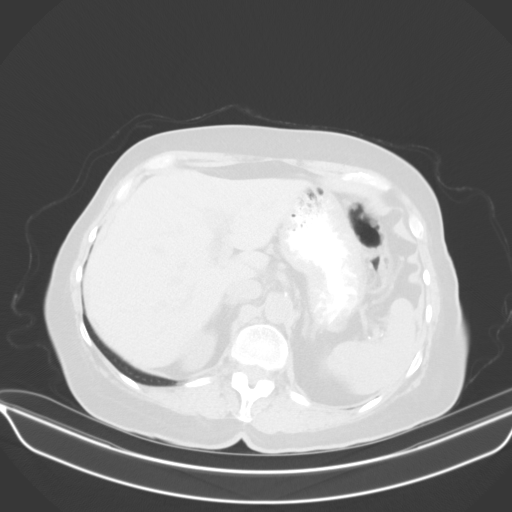
[im 73/90  soft-tissue]
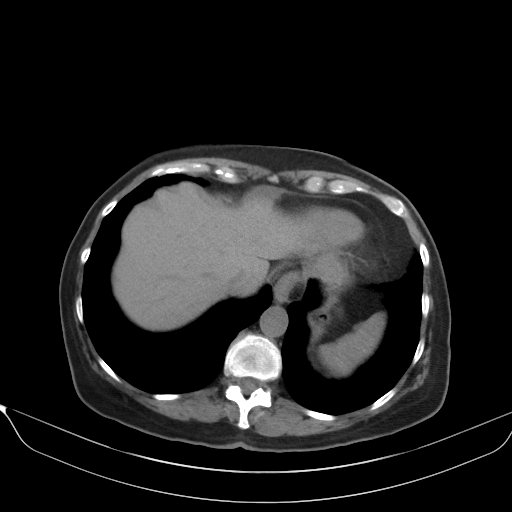
[im 73/90  lung]
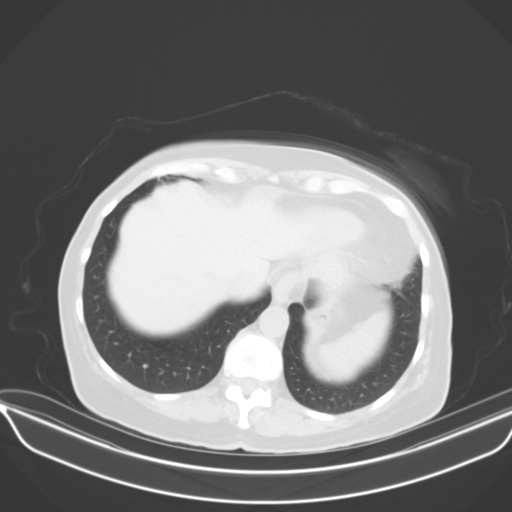
[im 78/90  soft-tissue]
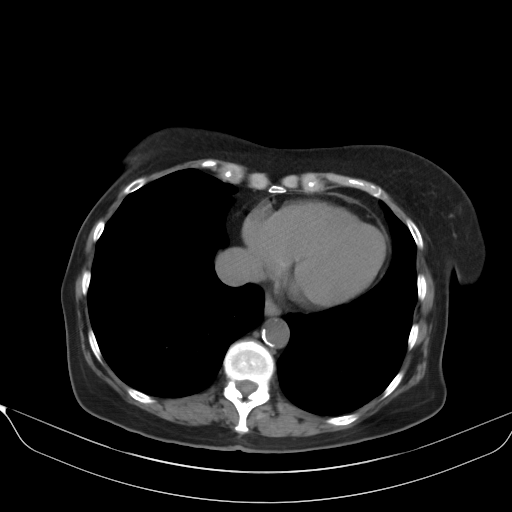
[im 78/90  lung]
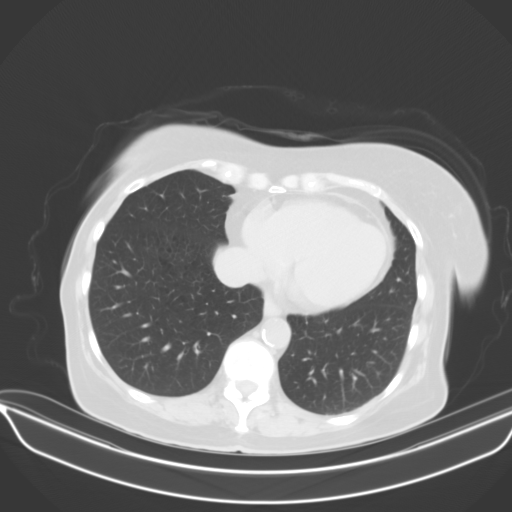
[im 84/90  soft-tissue]
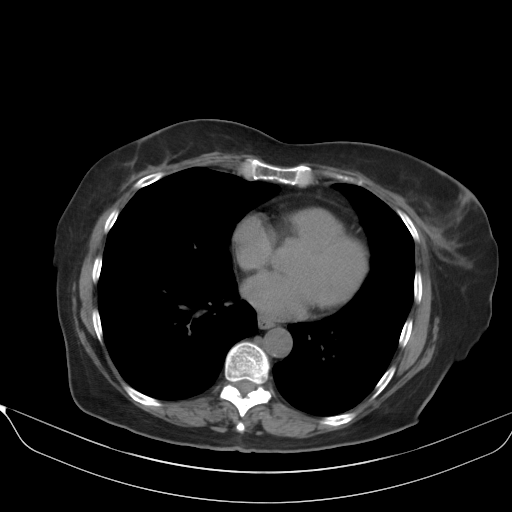
[im 84/90  lung]
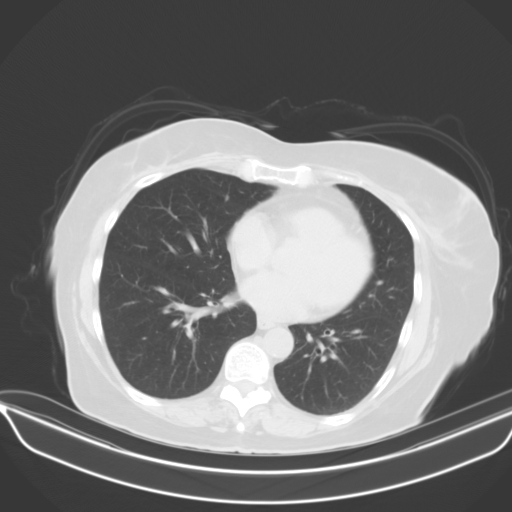

[14 of 32 positions shown; findings below may reference images not displayed]

FINDINGS: Lower chest: Centrilobular emphysema. No acute abnormality. Normal
size heart. No significant pericardial effusion/thickening. LAD and
left circumflex coronary artery calcifications.

Hepatobiliary: Unremarkable noncontrast appearance of the hepatic
parenchyma. Gallbladder is unremarkable. No biliary ductal dilation.

Pancreas: Unremarkable noncontrast appearance of the pancreatic
parenchyma. No pancreatic ductal dilation.

Spleen: Within normal limits.

Adrenals/Urinary Tract: Bilateral adrenal glands are unremarkable.
No hydronephrosis. No nephrolithiasis. Hyperdense subcentimeter
right upper pole renal lesion on image 61/3 with Hounsfield units of
69, consistent with a hemorrhagic/proteinaceous cyst. Urinary
bladder is unremarkable for degree of distension.

Stomach/Bowel: Radiopaque enteric contrast traverses the cecum. Tiny
hiatal hernia otherwise the stomach is grossly unremarkable. Normal
positioning of the duodenum/ligament of Treitz. The appendix and
terminal ileum appear normal. Sigmoid colon and rectum are
predominantly decompressed limiting evaluation. Small volume formed
stool throughout the ascending and transverse colon.

Vascular/Lymphatic: Aortic atherosclerosis without aneurysmal
dilation. No pathologically enlarged abdominal or pelvic lymph
nodes.

Reproductive: Uterus and bilateral adnexa are unremarkable.

Other: No abdominopelvic ascites.

Musculoskeletal: Mild levoconvex curvature of the thoracolumbar
spine. Mild multilevel degenerative change of the spine.
Degenerative change of the hips and SI joints. No acute osseous
abnormality.
IMPRESSION: 1. No acute abnormality in the abdomen or pelvis. Specifically, no
abnormal finding in the abdomen or pelvis to explain patient's
abdominal pain and weight loss.
2. Aortic Atherosclerosis (GFNGR-M5I.I) and Emphysema (GFNGR-H9N.F).

## 2023-09-09 ENCOUNTER — Other Ambulatory Visit (HOSPITAL_COMMUNITY): Payer: Self-pay | Admitting: Orthopedic Surgery

## 2023-10-14 ENCOUNTER — Encounter (HOSPITAL_BASED_OUTPATIENT_CLINIC_OR_DEPARTMENT_OTHER): Payer: Self-pay | Admitting: Orthopedic Surgery

## 2023-10-14 ENCOUNTER — Other Ambulatory Visit: Payer: Self-pay

## 2023-10-17 ENCOUNTER — Encounter (HOSPITAL_BASED_OUTPATIENT_CLINIC_OR_DEPARTMENT_OTHER)
Admission: RE | Admit: 2023-10-17 | Discharge: 2023-10-17 | Disposition: A | Discharge status: Home / Self Care | Source: Ambulatory Visit | Attending: Orthopedic Surgery | Admitting: Orthopedic Surgery

## 2023-10-17 DIAGNOSIS — Z01810 Encounter for preprocedural cardiovascular examination: Secondary | ICD-10-CM | POA: Insufficient documentation

## 2023-10-17 DIAGNOSIS — R002 Palpitations: Secondary | ICD-10-CM | POA: Diagnosis not present

## 2023-10-17 NOTE — Progress Notes (Signed)

## 2023-10-19 NOTE — Anesthesia Preprocedure Evaluation (Signed)
 Anesthesia Evaluation  Patient identified by MRN, date of birth, ID band Patient awake    Reviewed: Allergy & Precautions, NPO status , Patient's Chart, lab work & pertinent test results  History of Anesthesia Complications (+) PONV and history of anesthetic complications  Airway Mallampati: II  TM Distance: >3 FB Neck ROM: Full    Dental no notable dental hx. (+) Teeth Intact, Dental Advisory Given   Pulmonary former smoker   Pulmonary exam normal breath sounds clear to auscultation       Cardiovascular hypertension, Pt. on medications (-) angina (-) Past MI Normal cardiovascular exam+ dysrhythmias  Rhythm:Regular Rate:Normal     Neuro/Psych  PSYCHIATRIC DISORDERS Anxiety Depression    TIA   GI/Hepatic hiatal hernia,GERD  ,,  Endo/Other    Renal/GU      Musculoskeletal  (+) Arthritis , Osteoarthritis,  Fibromyalgia -  Abdominal   Peds  Hematology   Anesthesia Other Findings ALL: See list  Reproductive/Obstetrics                             Anesthesia Physical Anesthesia Plan  ASA: 3  Anesthesia Plan: Regional and General   Post-op Pain Management: Regional block* and Minimal or no pain anticipated   Induction: Intravenous  PONV Risk Score and Plan: 4 or greater and Propofol infusion, TIVA, Treatment may vary due to age or medical condition, Midazolam and Ondansetron  Airway Management Planned: LMA  Additional Equipment:   Intra-op Plan:   Post-operative Plan: Extubation in OR  Informed Consent:      Dental advisory given  Plan Discussed with:   Anesthesia Plan Comments: (LMA TIVA L pop adductor)       Anesthesia Quick Evaluation

## 2023-10-20 ENCOUNTER — Other Ambulatory Visit: Payer: Self-pay

## 2023-10-20 ENCOUNTER — Encounter (HOSPITAL_BASED_OUTPATIENT_CLINIC_OR_DEPARTMENT_OTHER): Payer: Self-pay | Admitting: Orthopedic Surgery

## 2023-10-20 ENCOUNTER — Ambulatory Visit (HOSPITAL_BASED_OUTPATIENT_CLINIC_OR_DEPARTMENT_OTHER)
Admission: RE | Admit: 2023-10-20 | Discharge: 2023-10-20 | Disposition: A | Discharge status: Home / Self Care | Payer: Medicare Other | Attending: Orthopedic Surgery | Admitting: Orthopedic Surgery

## 2023-10-20 ENCOUNTER — Encounter (HOSPITAL_BASED_OUTPATIENT_CLINIC_OR_DEPARTMENT_OTHER)
Admission: RE | Disposition: A | Discharge status: Home / Self Care | Payer: Self-pay | Source: Home / Self Care | Attending: Orthopedic Surgery

## 2023-10-20 ENCOUNTER — Ambulatory Visit (HOSPITAL_BASED_OUTPATIENT_CLINIC_OR_DEPARTMENT_OTHER): Payer: Self-pay | Admitting: Anesthesiology

## 2023-10-20 ENCOUNTER — Ambulatory Visit (HOSPITAL_BASED_OUTPATIENT_CLINIC_OR_DEPARTMENT_OTHER)

## 2023-10-20 DIAGNOSIS — T8484XA Pain due to internal orthopedic prosthetic devices, implants and grafts, initial encounter: Secondary | ICD-10-CM | POA: Diagnosis not present

## 2023-10-20 DIAGNOSIS — I10 Essential (primary) hypertension: Secondary | ICD-10-CM | POA: Diagnosis not present

## 2023-10-20 DIAGNOSIS — M2042 Other hammer toe(s) (acquired), left foot: Secondary | ICD-10-CM

## 2023-10-20 DIAGNOSIS — Z79899 Other long term (current) drug therapy: Secondary | ICD-10-CM | POA: Insufficient documentation

## 2023-10-20 DIAGNOSIS — M797 Fibromyalgia: Secondary | ICD-10-CM | POA: Insufficient documentation

## 2023-10-20 DIAGNOSIS — Y831 Surgical operation with implant of artificial internal device as the cause of abnormal reaction of the patient, or of later complication, without mention of misadventure at the time of the procedure: Secondary | ICD-10-CM | POA: Diagnosis not present

## 2023-10-20 DIAGNOSIS — F32A Depression, unspecified: Secondary | ICD-10-CM | POA: Diagnosis not present

## 2023-10-20 DIAGNOSIS — Z8673 Personal history of transient ischemic attack (TIA), and cerebral infarction without residual deficits: Secondary | ICD-10-CM | POA: Diagnosis not present

## 2023-10-20 DIAGNOSIS — R002 Palpitations: Secondary | ICD-10-CM

## 2023-10-20 DIAGNOSIS — Z87891 Personal history of nicotine dependence: Secondary | ICD-10-CM

## 2023-10-20 DIAGNOSIS — F419 Anxiety disorder, unspecified: Secondary | ICD-10-CM | POA: Insufficient documentation

## 2023-10-20 HISTORY — PX: HARDWARE REMOVAL: SHX979

## 2023-10-20 SURGERY — REMOVAL, HARDWARE
Anesthesia: Regional | Site: Foot | Laterality: Left

## 2023-10-20 MED ORDER — 0.9 % SODIUM CHLORIDE (POUR BTL) OPTIME
TOPICAL | Status: DC | PRN
  Administered 2023-10-20: 1000 mL

## 2023-10-20 MED ORDER — ONDANSETRON HCL 4 MG/2ML IJ SOLN
INTRAMUSCULAR | Status: AC
  Filled 2023-10-20: qty 2

## 2023-10-20 MED ORDER — CLONIDINE HCL (ANALGESIA) 100 MCG/ML EP SOLN
EPIDURAL | Status: DC | PRN
  Administered 2023-10-20: 50 ug
  Administered 2023-10-20: 100 ug

## 2023-10-20 MED ORDER — DOCUSATE SODIUM 100 MG PO CAPS: 100.0000 mg | ORAL_CAPSULE | Freq: Two times a day (BID) | ORAL | 0 refills | Status: AC

## 2023-10-20 MED ORDER — MIDAZOLAM HCL 2 MG/2ML IJ SOLN
INTRAMUSCULAR | Status: AC
  Filled 2023-10-20: qty 2

## 2023-10-20 MED ORDER — FENTANYL CITRATE (PF) 100 MCG/2ML IJ SOLN
100.0000 ug | Freq: Once | INTRAMUSCULAR | Status: AC
  Administered 2023-10-20: 50 ug via INTRAVENOUS

## 2023-10-20 MED ORDER — OXYCODONE HCL 5 MG PO TABS: 5.0000 mg | ORAL_TABLET | Freq: Four times a day (QID) | ORAL | 0 refills | Status: AC | PRN

## 2023-10-20 MED ORDER — FENTANYL CITRATE (PF) 100 MCG/2ML IJ SOLN
INTRAMUSCULAR | Status: AC
  Filled 2023-10-20: qty 2

## 2023-10-20 MED ORDER — FENTANYL CITRATE (PF) 100 MCG/2ML IJ SOLN
INTRAMUSCULAR | Status: DC | PRN
  Administered 2023-10-20 (×2): 25 ug via INTRAVENOUS

## 2023-10-20 MED ORDER — LACTATED RINGERS IV SOLN: INTRAVENOUS | Status: DC

## 2023-10-20 MED ORDER — PROPOFOL 10 MG/ML IV BOLUS
INTRAVENOUS | Status: DC | PRN
  Administered 2023-10-20: 150 ug via INTRAVENOUS

## 2023-10-20 MED ORDER — DEXAMETHASONE SODIUM PHOSPHATE 10 MG/ML IJ SOLN
INTRAMUSCULAR | Status: DC | PRN
  Administered 2023-10-20: 6 mg via INTRAVENOUS

## 2023-10-20 MED ORDER — LIDOCAINE 2% (20 MG/ML) 5 ML SYRINGE
INTRAMUSCULAR | Status: DC | PRN
  Administered 2023-10-20: 80 mg via INTRAVENOUS

## 2023-10-20 MED ORDER — PHENYLEPHRINE HCL (PRESSORS) 10 MG/ML IV SOLN
INTRAVENOUS | Status: DC | PRN
Start: 2023-10-20 — End: 2023-10-20
  Administered 2023-10-20: 160 ug via INTRAVENOUS

## 2023-10-20 MED ORDER — PROPOFOL 500 MG/50ML IV EMUL
INTRAVENOUS | Status: DC | PRN
  Administered 2023-10-20: 175 ug/kg/min via INTRAVENOUS

## 2023-10-20 MED ORDER — VANCOMYCIN HCL 500 MG IV SOLR
INTRAVENOUS | Status: DC | PRN
  Administered 2023-10-20: 500 mg via TOPICAL

## 2023-10-20 MED ORDER — LACTATED RINGERS IV SOLN: INTRAVENOUS | Status: DC | PRN

## 2023-10-20 MED ORDER — ONDANSETRON HCL 4 MG/2ML IJ SOLN
INTRAMUSCULAR | Status: DC | PRN
  Administered 2023-10-20: 4 mg via INTRAVENOUS

## 2023-10-20 MED ORDER — KETOROLAC TROMETHAMINE 30 MG/ML IJ SOLN
INTRAMUSCULAR | Status: AC
  Filled 2023-10-20: qty 1

## 2023-10-20 MED ORDER — CEFAZOLIN SODIUM-DEXTROSE 2-4 GM/100ML-% IV SOLN
INTRAVENOUS | Status: AC
Start: 2023-10-20 — End: ?
  Filled 2023-10-20: qty 100

## 2023-10-20 MED ORDER — ACETAMINOPHEN 10 MG/ML IV SOLN: 1000.0000 mg | Freq: Once | INTRAVENOUS | Status: DC | PRN

## 2023-10-20 MED ORDER — ROPIVACAINE HCL 5 MG/ML IJ SOLN
INTRAMUSCULAR | Status: DC | PRN
  Administered 2023-10-20: 15 mL via PERINEURAL
  Administered 2023-10-20: 30 mL via PERINEURAL

## 2023-10-20 MED ORDER — FENTANYL CITRATE (PF) 100 MCG/2ML IJ SOLN: 25.0000 ug | INTRAMUSCULAR | Status: DC | PRN

## 2023-10-20 MED ORDER — MIDAZOLAM HCL 2 MG/2ML IJ SOLN
2.0000 mg | Freq: Once | INTRAMUSCULAR | Status: AC
  Administered 2023-10-20: 2 mg via INTRAVENOUS

## 2023-10-20 MED ORDER — DEXAMETHASONE SODIUM PHOSPHATE 10 MG/ML IJ SOLN
INTRAMUSCULAR | Status: AC
  Filled 2023-10-20: qty 1

## 2023-10-20 MED ORDER — ONDANSETRON HCL 4 MG/2ML IJ SOLN: 4.0000 mg | Freq: Once | INTRAMUSCULAR | Status: DC | PRN

## 2023-10-20 MED ORDER — LIDOCAINE 2% (20 MG/ML) 5 ML SYRINGE
INTRAMUSCULAR | Status: AC
  Filled 2023-10-20: qty 5

## 2023-10-20 MED ORDER — SENNA 8.6 MG PO TABS: 2.0000 | ORAL_TABLET | Freq: Two times a day (BID) | ORAL | 0 refills | Status: AC

## 2023-10-20 MED ORDER — CEFAZOLIN SODIUM-DEXTROSE 2-4 GM/100ML-% IV SOLN
2.0000 g | INTRAVENOUS | Status: AC
  Administered 2023-10-20: 2 g via INTRAVENOUS

## 2023-10-20 MED ORDER — DEXMEDETOMIDINE HCL IN NACL 80 MCG/20ML IV SOLN
INTRAVENOUS | Status: DC | PRN
  Administered 2023-10-20: 4 ug via INTRAVENOUS

## 2023-10-20 SURGICAL SUPPLY — 64 items
BANDAGE ESMARK 6X9 LF (GAUZE/BANDAGES/DRESSINGS) IMPLANT
BENZOIN TINCTURE PRP APPL 2/3 (GAUZE/BANDAGES/DRESSINGS) IMPLANT
BIT DRILL 1.8 CANN MAX VPC (BIT) ×1 IMPLANT
BLADE AVERAGE 25X9 (BLADE) ×1 IMPLANT
BLADE MICRO SAGITTAL (BLADE) IMPLANT
BLADE OSC/SAG .038X5.5 CUT EDG (BLADE) IMPLANT
BLADE SURG 15 STRL LF DISP TIS (BLADE) ×4 IMPLANT
BNDG ELASTIC 4INX 5YD STR LF (GAUZE/BANDAGES/DRESSINGS) ×2 IMPLANT
BNDG ELASTIC 6INX 5YD STR LF (GAUZE/BANDAGES/DRESSINGS) IMPLANT
BNDG ESMARK 4X9 LF (GAUZE/BANDAGES/DRESSINGS) IMPLANT
BNDG ESMARK 6X9 LF (GAUZE/BANDAGES/DRESSINGS) IMPLANT
BNDG STRETCH GAUZE 3IN X12FT (GAUZE/BANDAGES/DRESSINGS) ×2 IMPLANT
BOOT STEPPER DURA LG (SOFTGOODS) IMPLANT
BOOT STEPPER DURA MED (SOFTGOODS) IMPLANT
BOOT STEPPER DURA SM (SOFTGOODS) IMPLANT
BOOT STEPPER DURA XLG (SOFTGOODS) IMPLANT
CHLORAPREP W/TINT 26 (MISCELLANEOUS) ×2 IMPLANT
COVER BACK TABLE 60X90IN (DRAPES) ×2 IMPLANT
CUFF TRNQT CYL 34X4.125X (TOURNIQUET CUFF) ×1 IMPLANT
DRAPE EXTREMITY T 121X128X90 (DISPOSABLE) ×2 IMPLANT
DRAPE OEC MINIVIEW 54X84 (DRAPES) ×2 IMPLANT
DRAPE SURG 17X23 STRL (DRAPES) IMPLANT
DRAPE U-SHAPE 47X51 STRL (DRAPES) ×2 IMPLANT
DRSG MEPITEL 4X7.2 (GAUZE/BANDAGES/DRESSINGS) ×2 IMPLANT
ELECT REM PT RETURN 9FT ADLT (ELECTROSURGICAL) ×2 IMPLANT
ELECTRODE REM PT RTRN 9FT ADLT (ELECTROSURGICAL) ×2 IMPLANT
GAUZE PAD ABD 8X10 STRL (GAUZE/BANDAGES/DRESSINGS) ×2 IMPLANT
GAUZE SPONGE 4X4 12PLY STRL (GAUZE/BANDAGES/DRESSINGS) ×2 IMPLANT
GLOVE BIO SURGEON STRL SZ8 (GLOVE) ×2 IMPLANT
GLOVE BIOGEL PI IND STRL 8 (GLOVE) ×4 IMPLANT
GLOVE ECLIPSE 8.0 STRL XLNG CF (GLOVE) ×2 IMPLANT
GOWN STRL REUS W/ TWL LRG LVL3 (GOWN DISPOSABLE) ×3 IMPLANT
GOWN STRL REUS W/ TWL XL LVL3 (GOWN DISPOSABLE) ×4 IMPLANT
K-WIRE COCR 0.9X95 (WIRE) ×2 IMPLANT
KWIRE COCR 0.9X95 (WIRE) ×1 IMPLANT
NDL HYPO 22X1.5 SAFETY MO (MISCELLANEOUS) IMPLANT
NEEDLE HYPO 22X1.5 SAFETY MO (MISCELLANEOUS) IMPLANT
PACK BASIN DAY SURGERY FS (CUSTOM PROCEDURE TRAY) ×2 IMPLANT
PAD CAST 4YDX4 CTTN HI CHSV (CAST SUPPLIES) ×2 IMPLANT
PADDING CAST ABS COTTON 4X4 ST (CAST SUPPLIES) ×1 IMPLANT
PADDING CAST COTTON 6X4 STRL (CAST SUPPLIES) IMPLANT
PENCIL SMOKE EVACUATOR (MISCELLANEOUS) ×2 IMPLANT
SANITIZER HAND PURELL FF 515ML (MISCELLANEOUS) ×2 IMPLANT
SCREW MAX VPC 2.5X20 (Screw) ×1 IMPLANT
SHEET MEDIUM DRAPE 40X70 STRL (DRAPES) ×2 IMPLANT
SLEEVE SCD COMPRESS KNEE MED (STOCKING) ×2 IMPLANT
SPIKE FLUID TRANSFER (MISCELLANEOUS) IMPLANT
SPLINT PLASTER CAST FAST 5X30 (CAST SUPPLIES) IMPLANT
SPONGE SURGIFOAM ABS GEL 12-7 (HEMOSTASIS) IMPLANT
SPONGE T-LAP 18X18 ~~LOC~~+RFID (SPONGE) ×2 IMPLANT
STOCKINETTE 6 STRL (DRAPES) ×2 IMPLANT
STRIP CLOSURE SKIN 1/2X4 (GAUZE/BANDAGES/DRESSINGS) IMPLANT
SUCTION TUBE FRAZIER 10FR DISP (SUCTIONS) ×2 IMPLANT
SUT ETHILON 3 0 PS 1 (SUTURE) ×2 IMPLANT
SUT MNCRL AB 3-0 PS2 18 (SUTURE) ×1 IMPLANT
SUT VIC AB 2-0 SH 18 (SUTURE) IMPLANT
SUT VIC AB 2-0 SH 27XBRD (SUTURE) ×1 IMPLANT
SUT VICRYL 0 SH 27 (SUTURE) IMPLANT
SYR BULB EAR ULCER 3OZ GRN STR (SYRINGE) ×2 IMPLANT
SYR CONTROL 10ML LL (SYRINGE) IMPLANT
TOWEL GREEN STERILE FF (TOWEL DISPOSABLE) ×4 IMPLANT
TUBE CONNECTING 20X1/4 (TUBING) ×2 IMPLANT
UNDERPAD 30X36 HEAVY ABSORB (UNDERPADS AND DIAPERS) ×2 IMPLANT
YANKAUER SUCT BULB TIP NO VENT (SUCTIONS) IMPLANT

## 2023-10-20 NOTE — Transfer of Care (Signed)
 Immediate Anesthesia Transfer of Care Note  Patient: Terry Ball  Procedure(s) Performed: HARDWARE REMOVAL OF LEFT 1ST METATARSAL, HALLUX PROXIMAL PHALANX, AND 2ND METATARSAL; SECOND HAMMERTOE CORRECTION (Left: Foot)  Patient Location: PACU  Anesthesia Type:GA combined with regional for post-op pain  Level of Consciousness: drowsy  Airway & Oxygen Therapy: Patient Spontanous Breathing and Patient connected to face mask oxygen  Post-op Assessment: Report given to RN and Post -op Vital signs reviewed and stable  Post vital signs: Reviewed and stable  Last Vitals:  Vitals Value Taken Time  BP 102/43 10/20/23 1156  Temp 36.4 C 10/20/23 1156  Pulse 73 10/20/23 1156  Resp 12 10/20/23 1156  SpO2 100 % 10/20/23 1156  Vitals shown include unfiled device data.  Last Pain:  Vitals:   10/20/23 0929  TempSrc: Temporal  PainSc: 5       Patients Stated Pain Goal: 6 (10/20/23 0929)  Complications: No notable events documented.

## 2023-10-20 NOTE — Progress Notes (Signed)
Assisted Dr. Houser with left, adductor canal, popliteal, ultrasound guided block. Side rails up, monitors on throughout procedure. See vital signs in flow sheet. Tolerated Procedure well. 

## 2023-10-20 NOTE — Discharge Instructions (Addendum)
 Toni Arthurs, MD [facility]  Please read the following information regarding your care after surgery.  Medications  You only need a prescription for the narcotic pain medicine (ex. oxycodone, Percocet, Norco).  All of the other medicines listed below are available over the counter. ? Aleve 2 pills twice a day for the first 3 days after surgery. ? acetominophen (Tylenol) 650 mg every 4-6 hours as you need for minor to moderate pain ? oxycodone as prescribed for severe pain  Narcotic pain medicine (ex. oxycodone, Percocet, Vicodin) will cause constipation.  To prevent this problem, take the following medicines while you are taking any pain medicine. ? docusate sodium (Colace) 100 mg twice a day ? senna (Senokot) 2 tablets twice a day  Weight Bearing ? Bear weight only on your operated foot in the post-op shoe.   Cast / Splint / Dressing ? Keep your splint, cast or dressing clean and dry.  Don't put anything (coat hanger, pencil, etc) down inside of it.  If it gets damp, use a hair dryer on the cool setting to dry it.  If it gets soaked, call the office to schedule an appointment for a cast change.   After your dressing, cast or splint is removed; you may shower, but do not soak or scrub the wound.  Allow the water to run over it, and then gently pat it dry.  Swelling It is normal for you to have swelling where you had surgery.  To reduce swelling and pain, keep your toes above your nose for at least 3 days after surgery.  It may be necessary to keep your foot or leg elevated for several weeks.  If it hurts, it should be elevated.  Follow Up Call my office at [phone] when you are discharged from the hospital or surgery center to schedule an appointment to be seen two weeks after surgery.  Call my office at [phone] if you develop a fever >101.5 F, nausea, vomiting, bleeding from the surgical site or severe pain.     Post Anesthesia Home Care Instructions  Activity: Get  plenty of rest for the remainder of the day. A responsible individual must stay with you for 24 hours following the procedure.  For the next 24 hours, DO NOT: -Drive a car -Advertising copywriter -Drink alcoholic beverages -Take any medication unless instructed by your physician -Make any legal decisions or sign important papers.  Meals: Start with liquid foods such as gelatin or soup. Progress to regular foods as tolerated. Avoid greasy, spicy, heavy foods. If nausea and/or vomiting occur, drink only clear liquids until the nausea and/or vomiting subsides. Call your physician if vomiting continues.  Special Instructions/Symptoms: Your throat may feel dry or sore from the anesthesia or the breathing tube placed in your throat during surgery. If this causes discomfort, gargle with warm salt water. The discomfort should disappear within 24 hours.  If you had a scopolamine patch placed behind your ear for the management of post- operative nausea and/or vomiting:  1. The medication in the patch is effective for 72 hours, after which it should be removed.  Wrap patch in a tissue and discard in the trash. Wash hands thoroughly with soap and water. 2. You may remove the patch earlier than 72 hours if you experience unpleasant side effects which may include dry mouth, dizziness or visual disturbances. 3. Avoid touching the patch. Wash your hands with soap and water after contact with the patch.   Regional Anesthesia Blocks  1. You  may not be able to move or feel the "blocked" extremity after a regional anesthetic block. This may last may last from 3-48 hours after placement, but it will go away. The length of time depends on the medication injected and your individual response to the medication. As the nerves start to wake up, you may experience tingling as the movement and feeling returns to your extremity. If the numbness and inability to move your extremity has not gone away after 48 hours, please call  your surgeon.   2. The extremity that is blocked will need to be protected until the numbness is gone and the strength has returned. Because you cannot feel it, you will need to take extra care to avoid injury. Because it may be weak, you may have difficulty moving it or using it. You may not know what position it is in without looking at it while the block is in effect.  3. For blocks in the legs and feet, returning to weight bearing and walking needs to be done carefully. You will need to wait until the numbness is entirely gone and the strength has returned. You should be able to move your leg and foot normally before you try and bear weight or walk. You will need someone to be with you when you first try to ensure you do not fall and possibly risk injury.  4. Bruising and tenderness at the needle site are common side effects and will resolve in a few days.  5. Persistent numbness or new problems with movement should be communicated to the surgeon or the [facility] [phone] [facility] [phone]).

## 2023-10-20 NOTE — H&P (Signed)
 Terry Ball is an 73 y.o. female.   Chief Complaint: Left forefoot pain HPI: 73 year old female without significant past medical history complains of worsening left forefoot pain due to a bunion, hallux rigidus, metatarsalgia and a second hammertoe deformity.  She has failed nonoperative treatment including activity modification, oral anti-inflammatories and shoewear modification.  She presents now for surgical treatment of these painful left forefoot conditions.  Past Medical History:  Diagnosis Date   Anemia    Anxiety    Bursitis of other site    "neck"   Chronic back pain    Deafness in left ear    Depression    Dysrhythmia    "heart racing" takes atenolol   Falls frequently    "lately" (03/15/2014)   Fibromyalgia    GERD (gastroesophageal reflux disease)    H/O hiatal hernia    High cholesterol    Hypotension    Neuropathy    both feet   Normal cardiac stress test 2013   Osteoarthritis of both knees    PONV (postoperative nausea and vomiting)    SVT (supraventricular tachycardia) (HCC)    diagnosed by [facility] 2000   TIA (transient ischemic attack)    "possibly; years ago" (03/15/2014), 12/2020    Past Surgical History:  Procedure Laterality Date   BREAST BIOPSY Right 12/15/2021   BUNIONECTOMY WITH WEIL OSTEOTOMY Left 02/09/2018   Procedure: Left SCARF osteotomy; modified, McBride;  AKIN bunionectomy; left 2-3 metatarsal Weil osteotomy;  Surgeon: Toni Arthurs, MD;  Location: [facility];  Service: Orthopedics;  Laterality: Left;   DILATION AND CURETTAGE OF UTERUS     RADIOACTIVE SEED GUIDED EXCISIONAL BREAST BIOPSY Right 02/16/2022   Procedure: RIGHT BREAST SEED GUIDED EXCISIONAL BIOPSY;  Surgeon: Emelia Loron, MD;  Location: [facility];  Service: General;  Laterality: Right;  LMA   SHOULDER ARTHROSCOPY Bilateral    "spurs"   TUBAL LIGATION      Family History  Problem Relation Age of Onset   CAD Brother    Heart  attack Brother    CAD Paternal Grandfather    Heart attack Paternal Grandfather    Brain cancer Father    Social History:  reports that she quit smoking about 11 years ago. Her smoking use included cigarettes. She started smoking about 41 years ago. She has a 30 pack-year smoking history. She has never used smokeless tobacco. She reports current alcohol use. She reports that she does not use drugs.  Allergies:  Allergies  Allergen Reactions   Bee Venom Swelling   Contrast Media [Iodinated Contrast Media] Anaphylaxis   Buprenorphine Hcl Itching   Codeine Other (See Comments)    REACTION: rash, itching, GI intolerance, GI upset   Gabapentin Nausea Only    drowniess and stomach upset   Influenza Vaccines Hives   Morphine And Codeine Itching    + nausea   Sulfa Antibiotics Nausea Only   Ropivacaine Hcl-Nacl Palpitations   Sodium Hyaluronate (Avian) Palpitations    Medications Prior to Admission  Medication Sig Dispense Refill   amitriptyline (ELAVIL) 50 MG tablet Take 50 mg by mouth at bedtime.      atenolol (TENORMIN) 50 MG tablet Take 50 mg by mouth 2 (two) times daily.     cholecalciferol (VITAMIN D) 1000 units tablet Take 1,000 Units by mouth daily.     diclofenac Sodium (VOLTAREN) 1 % GEL Apply topically 4 (four) times daily.     lamoTRIgine (LAMICTAL) 25 MG tablet Take by  mouth.     Loratadine 10 MG CAPS Take 10 mg by mouth daily as needed.     Multiple Vitamin (MULTIVITAMIN) tablet Take 1 tablet by mouth daily.     omeprazole (PRILOSEC) 40 MG capsule Take 40 mg by mouth 2 (two) times daily.     simvastatin (ZOCOR) 40 MG tablet Take 40 mg by mouth at bedtime.     triamcinolone cream (KENALOG) 0.1 % Apply topically.     valACYclovir (VALTREX) 500 MG tablet Take 1 tablet by mouth daily as needed.     vitamin C (ASCORBIC ACID) 500 MG tablet Take 500 mg by mouth daily.      No results found for this or any previous visit (from the past 48 hours). No results found.  Review of  Systems no recent fever, chills, nausea, vomiting or changes in her appetite  Height 5\' 2"  (1.575 m), weight 70.3 kg. Physical Exam  Well-nourished well-developed woman in no apparent distress.  Alert and oriented.  Normal mood and affect.  Extraocular motions are intact.  Respirations are unlabored.  Gait is slightly antalgic to the left.  She has decreased range of motion at the hallux MP joint.  The left foot has healthy skin and palpable pulses.  No lymphadenopathy.  Intact sensibility to light touch dorsally and plantarly at the forefoot.  Second hammertoe deformity is also noted.   Assessment/Plan Left hallux rigidus, metatarsalgia and second hammertoe deformity with retained painful hardware -to the operating room today for surgical treatment.  The risks and benefits of the alternative treatment options have been discussed in detail.  The patient wishes to proceed with surgery and specifically understands risks of bleeding, infection, nerve damage, blood clots, need for additional surgery, amputation and death.   Toni Arthurs, MD 11-08-23, 8:50 AM

## 2023-10-20 NOTE — Anesthesia Postprocedure Evaluation (Signed)
 Anesthesia Post Note  Patient: Else Habermann  Procedure(s) Performed: HARDWARE REMOVAL OF LEFT 1ST METATARSAL, HALLUX PROXIMAL PHALANX, AND 2ND METATARSAL; SECOND HAMMERTOE CORRECTION (Left: Foot)     Patient location during evaluation: PACU Anesthesia Type: Regional and General Level of consciousness: awake and alert Pain management: pain level controlled Vital Signs Assessment: post-procedure vital signs reviewed and stable Respiratory status: spontaneous breathing, nonlabored ventilation, respiratory function stable and patient connected to nasal cannula oxygen Cardiovascular status: blood pressure returned to baseline and stable Postop Assessment: no apparent nausea or vomiting Anesthetic complications: no  No notable events documented.  Last Vitals:  Vitals:   10/20/23 1245 10/20/23 1315  BP: (!) 110/50 (!) 130/57  Pulse: 72 72  Resp: 11 16  Temp:  (!) 36.2 C  SpO2: 94% 98%    Last Pain:  Vitals:   10/20/23 1315  TempSrc: Temporal  PainSc: 0-No pain                 Trevor Iha

## 2023-10-20 NOTE — Op Note (Signed)
 10/20/2023  11:57 AM  PATIENT:  Terry Ball  73 y.o. female  PRE-OPERATIVE DIAGNOSIS: 1.  left foot painful hardware at the first and second metatarsals and the hallux proximal phalanx 2.  Left second hammertoe deformity  POST-OPERATIVE DIAGNOSIS: Same  Procedure(s): 1.  Removal of deep implants left hallux proximal phalanx 2.  Removal of deep implants left first metatarsal through a separate incision 3.  Removal of deep implants left second metatarsal through separate incision 4.  Left second hammertoe correction 5.  Left foot AP and lateral radiographs  SURGEON:  Toni Arthurs, MD  ASSISTANT: Alfredo Martinez, PA-C  ANESTHESIA:   General, regional  EBL:  minimal   TOURNIQUET:   Total Tourniquet Time Documented: Thigh (Left) - 30 minutes Total: Thigh (Left) - 30 minutes  COMPLICATIONS:  None apparent  DISPOSITION:  Extubated, awake and stable to recovery.  INDICATION FOR PROCEDURE: 73 year old female with painful hardware after previous bunion correction.  She also has a screw in the second metatarsal head that is uncomfortable with shoewear.  She has a painful hammertoe deformity.  We have previously discussed hallux MP joint arthrodesis to treat her arthritis symptoms.  She has decided to avoid the surgery for the time being and proceed only with hardware removal and hammertoe correction.  The risks and benefits of the alternative treatment options have been discussed in detail.  The patient wishes to proceed with surgery and specifically understands risks of bleeding, infection, nerve damage, blood clots, need for additional surgery, amputation and death.   PROCEDURE IN DETAIL:  After pre operative consent was obtained, and the correct operative site was identified, the patient was brought to the operating room and placed supine on the OR table.  Anesthesia was administered.  Pre-operative antibiotics were administered.  A surgical timeout was taken.  The left lower extremity was  prepped and draped in standard sterile fashion with a tourniquet around the thigh.  The extremity was elevated, and the tourniquet was inflated to 250 mmHg.  The palpable screw heads were identified at the first metatarsal.  An incision was made longitudinally.  Dissection was carried sharply down through the subcutaneous tissues.  The distal screw was identified and removed without difficulty.  The proximal screw was then identified and removed as well.  Attention was turned to the medial aspect of the hallux.  The previous surgical incision was identified.  The distal portion of the incision was opened again sharply and dissection carried sharply down through the subcutaneous tissues.  The screw head was identified.  It was cleared of all soft tissue.  It was carefully removed without difficulty.  Attention was then turned to the second metatarsal.  A longitudinal incision was made at the previous surgical site.  Dissection was carried down through the subcutaneous tissues to the metatarsal head.  The dorsal bone was resected with a rondure exposing the screw head.  The screw was then removed without difficulty.  Attention was turned to the second toe.  A transverse incision was made over the PIP joint.  Dissection was carried sharply down through the subcutaneous tissues.  The head of the proximal phalanx was resected with the oscillating saw followed by the base of the middle phalanx.  The joint was reduced and pinned.  AP and lateral radiographs confirmed appropriate correction of the hammertoe deformity and position of the guidepin.  The guidepin was overdrilled.  A 2.5 mm [employer] VPC screw was then inserted after predrilling.  The screw was noted  to have excellent purchase and compressed the PIP joint appropriately.  Final AP and lateral radiographs of the left foot showed interval correction of the hammertoe deformity with PIP joint arthrodesis as well as hardware removal from the first and  second metatarsals on the hallux proximal phalanx.  The wounds were irrigated copiously and sprinkled with vancomycin powder.  The skin incisions were closed with 3-0 nylon.  Sterile dressings were applied followed by a compression wrap.  The tourniquet was released after application of the dressings.  The patient was awakened from anesthesia and transported to the recovery room in stable condition.   FOLLOW UP PLAN: Weightbearing as tolerated in a flat postop shoe.  Follow-up in the office in 2 weeks for suture removal.  No indication for DVT prophylaxis in this ambulatory patient.   RADIOGRAPHS: AP and lateral radiographs of the left foot are obtained intraoperatively.  These show interval correction of the hammertoe deformity and removal of hardware from the first and second metatarsals and the hallux proximal phalanx.  No other acute injuries are noted.    Alfredo Martinez PA-C was present and scrubbed for the duration of the operative case. His assistance was essential in positioning the patient, prepping and draping, gaining and maintaining exposure, performing the operation, closing and dressing the wounds and applying the splint.

## 2023-10-20 NOTE — Anesthesia Procedure Notes (Signed)
 Anesthesia Regional Block: Popliteal block   Pre-Anesthetic Checklist: , timeout performed,  Correct Patient, Correct Site, Correct Laterality,  Correct Procedure, Correct Position, site marked,  Risks and benefits discussed,  Pre-op evaluation,  At surgeon's request and post-op pain management  Laterality: Lower and Left  Prep: Maximum Sterile Barrier Precautions used, chloraprep       Needles:  Injection technique: Single-shot  Needle Type: Echogenic Needle     Needle Length: 9cm  Needle Gauge: 21     Additional Needles:   Procedures:,,,, ultrasound used (permanent image in chart),,    Narrative:  Start time: 10/20/2023 9:39 AM End time: 10/20/2023 9:44 AM Injection made incrementally with aspirations every 5 mL.  Performed by: Personally  Anesthesiologist: Trevor Iha, MD  Additional Notes: Block assessed. Patient tolerated procedure well.

## 2023-10-20 NOTE — Anesthesia Procedure Notes (Signed)
 Procedure Name: LMA Insertion Date/Time: 10/20/2023 11:05 AM  Performed by: Yolanda Bonine, CRNAPre-anesthesia Checklist: Patient identified, Emergency Drugs available, Suction available, Patient being monitored and Timeout performed Patient Re-evaluated:Patient Re-evaluated prior to induction Oxygen Delivery Method: Circle system utilized Preoxygenation: Pre-oxygenation with 100% oxygen Induction Type: IV induction Ventilation: Mask ventilation without difficulty LMA: LMA inserted LMA Size: 4.0 Number of attempts: 1 Placement Confirmation: positive ETCO2 Tube secured with: Tape Dental Injury: Teeth and Oropharynx as per pre-operative assessment

## 2023-10-20 NOTE — Anesthesia Procedure Notes (Signed)
 Anesthesia Regional Block: Adductor canal block   Pre-Anesthetic Checklist: , timeout performed,  Correct Patient, Correct Site, Correct Laterality,  Correct Procedure, Correct Position, site marked,  Risks and benefits discussed,  Surgical consent,  Pre-op evaluation,  At surgeon's request and post-op pain management  Laterality: Lower and Left  Prep: chloraprep       Needles:  Injection technique: Single-shot  Needle Type: Echogenic Needle     Needle Length: 9cm  Needle Gauge: 22     Additional Needles:   Procedures:,,,, ultrasound used (permanent image in chart),,    Narrative:  Start time: 10/20/2023 9:45 AM End time: 10/20/2023 9:48 AM Injection made incrementally with aspirations every 5 mL.  Performed by: Personally  Anesthesiologist: Trevor Iha, MD  Additional Notes: Block assessed prior to surgery. Pt tolerated procedure well.

## 2023-10-24 ENCOUNTER — Encounter (HOSPITAL_BASED_OUTPATIENT_CLINIC_OR_DEPARTMENT_OTHER): Payer: Self-pay | Admitting: Orthopedic Surgery
